# Patient Record
Sex: Female | Born: 1985 | Race: White | Hispanic: No | Smoking: Never smoker
Health system: Southern US, Community
[De-identification: ages and names within clinical notes are randomized; demographics above are authoritative.]

## PROBLEM LIST (undated history)

## (undated) DIAGNOSIS — Z87898 Personal history of other specified conditions: Secondary | ICD-10-CM

## (undated) DIAGNOSIS — O24419 Gestational diabetes mellitus in pregnancy, unspecified control: Secondary | ICD-10-CM

## (undated) DIAGNOSIS — B009 Herpesviral infection, unspecified: Secondary | ICD-10-CM

## (undated) DIAGNOSIS — Z789 Other specified health status: Secondary | ICD-10-CM

## (undated) DIAGNOSIS — K219 Gastro-esophageal reflux disease without esophagitis: Secondary | ICD-10-CM

## (undated) HISTORY — DX: Herpesviral infection, unspecified: B00.9

## (undated) HISTORY — DX: Gestational diabetes mellitus in pregnancy, unspecified control: O24.419

## (undated) HISTORY — DX: Personal history of other specified conditions: Z87.898

---

## 2008-02-17 ENCOUNTER — Observation Stay: Payer: Self-pay | Admitting: Obstetrics and Gynecology

## 2008-03-01 ENCOUNTER — Observation Stay: Payer: Self-pay | Admitting: Internal Medicine

## 2008-03-08 ENCOUNTER — Inpatient Hospital Stay: Payer: Self-pay | Admitting: Unknown Physician Specialty

## 2011-10-14 DIAGNOSIS — Z87898 Personal history of other specified conditions: Secondary | ICD-10-CM

## 2011-10-14 HISTORY — DX: Personal history of other specified conditions: Z87.898

## 2011-10-14 HISTORY — PX: WISDOM TOOTH EXTRACTION: SHX21

## 2011-10-27 ENCOUNTER — Encounter: Payer: Self-pay | Admitting: Family Medicine

## 2011-10-27 ENCOUNTER — Other Ambulatory Visit (HOSPITAL_COMMUNITY)
Admission: RE | Admit: 2011-10-27 | Discharge: 2011-10-27 | Disposition: A | Discharge status: Home / Self Care | Payer: BC Managed Care – PPO | Source: Ambulatory Visit | Attending: Family Medicine | Admitting: Family Medicine

## 2011-10-27 ENCOUNTER — Ambulatory Visit (INDEPENDENT_AMBULATORY_CARE_PROVIDER_SITE_OTHER): Payer: BC Managed Care – PPO | Admitting: Family Medicine

## 2011-10-27 DIAGNOSIS — R87810 Cervical high risk human papillomavirus (HPV) DNA test positive: Secondary | ICD-10-CM | POA: Insufficient documentation

## 2011-10-27 DIAGNOSIS — F329 Major depressive disorder, single episode, unspecified: Secondary | ICD-10-CM

## 2011-10-27 DIAGNOSIS — Z01419 Encounter for gynecological examination (general) (routine) without abnormal findings: Secondary | ICD-10-CM | POA: Insufficient documentation

## 2011-10-27 DIAGNOSIS — Z0000 Encounter for general adult medical examination without abnormal findings: Secondary | ICD-10-CM | POA: Insufficient documentation

## 2011-10-27 MED ORDER — LEVONORGESTREL 20 MCG/24HR IU IUD: 1.0000 | INTRAUTERINE_SYSTEM | Freq: Once | INTRAUTERINE | Status: DC

## 2011-10-27 MED ORDER — BUPROPION HCL ER (XL) 150 MG PO TB24: 150.0000 mg | ORAL_TABLET | Freq: Every day | ORAL | Status: DC

## 2011-10-27 NOTE — Patient Instructions (Signed)
Great to meet you. Please call me in 2-3 weeks with an update of your symptoms.  Take Wellbutrin first thing in the morning.

## 2011-10-27 NOTE — Progress Notes (Signed)
Subjective:     Charlene Garcia is a 26 y.o. female and is here for a comprehensive physical exam. The patient reports feeling depressed..  Married in April, has a 63 1/2 year old daughter. Never had issues with depression in past. Past three months, more tearful. Endorses anhedonia, difficulty concentrating and insomnia.  No SI or HI.  G1P1, no h/o abnormal pap smears.  History   Social History  . Marital Status: Married    Spouse Name: N/A    Number of Children: N/A  . Years of Education: N/A   Occupational History  . Not on file.   Social History Main Topics  . Smoking status: Never Smoker   . Smokeless tobacco: Not on file  . Alcohol Use: Not on file  . Drug Use: Not on file  . Sexually Active: Not on file   Other Topics Concern  . Not on file   Social History Narrative  . No narrative on file   The PMH, PSH, Social History, Family History, Medications, and allergies have been reviewed in [facility], and have been updated if relevant.   Review of Systems  See HPI Patient reports no  vision/ hearing changes,anorexia, weight change, fever ,adenopathy, persistant / recurrent hoarseness, swallowing issues, chest pain, edema,persistant / recurrent cough, hemoptysis, dyspnea(rest, exertional, paroxysmal nocturnal), gastrointestinal  bleeding (melena, rectal bleeding), abdominal pain, excessive heart burn, GU symptoms(dysuria, hematuria, pyuria, voiding/incontinence  Issues) syncope, focal weakness, severe memory loss, concerning skin lesions, anxiety, abnormal bruising/bleeding, major joint swelling, breast masses or abnormal vaginal bleeding.     Objective:  BP 120/70  Pulse 98  Temp(Src) 98.7 F (37.1 C) (Oral)  Ht 5\' 4"  (1.626 m)  Wt 180 lb 12 oz (81.988 kg)  BMI 31.03 kg/m2  General:  Well-developed,well-nourished,in no acute distress; alert,appropriate and cooperative throughout examination Head:  normocephalic and atraumatic.   Eyes:  vision grossly intact,  pupils equal, pupils round, and pupils reactive to light.   Ears:  R ear normal and L ear normal.   Nose:  no external deformity.   Mouth:  good dentition.   Neck:  No deformities, masses, or tenderness noted. Breasts:  No mass, nodules, thickening, tenderness, bulging, retraction, inflamation, nipple discharge or skin changes noted.   Lungs:  Normal respiratory effort, chest expands symmetrically. Lungs are clear to auscultation, no crackles or wheezes. Heart:  Normal rate and regular rhythm. S1 and S2 normal without gallop, murmur, click, rub or other extra sounds. Abdomen:  Bowel sounds positive,abdomen soft and non-tender without masses, organomegaly or hernias noted. Rectal:  no external abnormalities.   Genitalia:  Pelvic Exam:        External: normal female genitalia without lesions or masses        Vagina: normal without lesions or masses        Cervix: normal without lesions or masses, IUD strings visible.        Adnexa: normal bimanual exam without masses or fullness        Uterus: normal by palpation        Pap smear: performed Msk:  No deformity or scoliosis noted of thoracic or lumbar spine.   Extremities:  No clubbing, cyanosis, edema, or deformity noted with normal full range of motion of all joints.   Neurologic:  alert & oriented X3 and gait normal.   Skin:  Intact without suspicious lesions or rashes Cervical Nodes:  No lymphadenopathy noted Axillary Nodes:  No palpable lymphadenopathy Psych:  Cognition and judgment  appear intact. Alert and cooperative with normal attention span and concentration. No apparent delusions, illusions, hallucinations    Assessment and Plan:  1. Routine general medical examination at a health care facility  Reviewed preventive care protocols, scheduled due services, and updated immunizations Discussed nutrition, exercise, diet, and healthy lifestyle.  Cytology -Pap Smear  2. Depression  New- discussed treatment options. Psychotherapy  deferred at this time. Will start Wellbutrin XL 150 mg every morning. Follow up in 2-3 weeks.

## 2011-11-19 ENCOUNTER — Telehealth: Payer: Self-pay | Admitting: Family Medicine

## 2011-11-19 NOTE — Telephone Encounter (Signed)
Triage Record Num: [id number] Operator: Arline Asp Loftin Patient Name: Charlene Garcia Call Date & Time: 11/19/2011 1:24:49PM Patient Phone: [phone] PCP: Ruthe Mannan Patient Gender: Female PCP Fax : [phone] Patient DOB: [birth year] Practice Name: [facility] Reason for Call: Caller: [name]/Patient; PCP: Ruthe Mannan [facility]); CB#: [phone]; Call regarding Blood in the Stool; LMP: IUD. Blood in bowel movement onset 02/05, after anal intercourse approximately 02/03. Emergent sx negative. Care advice per "Gastrointestinal Bleeding" with appt advised within 72h due to "rectal bleeding occuring after anal intercourse." Will call back for appt, call back parameters reviewed. Protocol(s) Used: Gastrointestinal Bleeding Recommended Outcome per Protocol: See Provider within 72 Hours Reason for Outcome: Rectal bleeding or persistent rectal pain occuring after anal intercourse Care Advice: ~ Sit in a warm bath 20 minutes 3 or 4 times a day. ~ Call provider immediately if develop larger amounts of rectal bleeding. ~ Do not sit on toilet for a prolonged time (reading while on toilet) and avoid straining at stool. Call provider if you develop continual rectal pain with swelling or redness or temperature of 101.5 F (38.5 C) or higher. ~ Analgesic/Antipyretic Advice - Acetaminophen: Consider acetaminophen as directed on label or by pharmacist/provider for pain or fever PRECAUTIONS: - Use if there is no history of liver disease, alcoholism, or intake of three or more alcohol drinks per day - Only if approved by provider during pregnancy or when breastfeeding - During pregnancy, acetaminophen should not be taken more than 3 consecutive days without telling provider - Do not exceed recommended dose or frequency ~ Consider use of nonprescription anti-inflammatory topical medication per pharmacist's or label recommendations (such as Preparation H, Anusol-HC, Cortacet) to relieve  itching or burning. Apply small amount to area. ~ 11/19/2011 1:33:53PM Page 1 of 1 [id number]

## 2011-12-29 ENCOUNTER — Other Ambulatory Visit: Payer: Self-pay | Admitting: Family Medicine

## 2012-02-05 ENCOUNTER — Ambulatory Visit (INDEPENDENT_AMBULATORY_CARE_PROVIDER_SITE_OTHER): Payer: BC Managed Care – PPO | Admitting: Family Medicine

## 2012-02-05 ENCOUNTER — Encounter: Payer: Self-pay | Admitting: Family Medicine

## 2012-02-05 VITALS — BP 122/82 | HR 60 | Temp 97.6°F | Ht 64.0 in | Wt 170.2 lb

## 2012-02-05 DIAGNOSIS — R300 Dysuria: Secondary | ICD-10-CM | POA: Insufficient documentation

## 2012-02-05 LAB — POCT URINE PREGNANCY: Preg Test, Ur: NEGATIVE

## 2012-02-05 LAB — POCT URINALYSIS DIPSTICK
Ketones, UA: NEGATIVE
Protein, UA: NEGATIVE
Spec Grav, UA: 1.02

## 2012-02-05 MED ORDER — CIPROFLOXACIN HCL 250 MG PO TABS: 250.0000 mg | ORAL_TABLET | Freq: Two times a day (BID) | ORAL | Status: DC

## 2012-02-05 NOTE — Patient Instructions (Signed)
Looks like urinary infection - treat with cipro twice daily for 3 days. Push fluids and plenty of rest. Update [place] if symptoms not completely resolved after this.

## 2012-02-05 NOTE — Assessment & Plan Note (Addendum)
sxs consistent with uti, treat with cipro 250 bid x 3 days. Update [place] if sxs not improved after this. Upreg today per pt request.

## 2012-02-05 NOTE — Progress Notes (Signed)
Addended by: Josph Macho A on: 02/05/2012 01:33 PM   Modules accepted: Orders

## 2012-02-05 NOTE — Progress Notes (Signed)
  Subjective:    Patient ID: Charlene Garcia, female    DOB: [birth year], 26 y.o.   MRN: [id number]  HPI CC: ?UTI  Yesterday started having some abd cramping, then this morning urgency increased.  Today with dysuria, urgency, frequency.  + abd cramping.  Feeling nauseated.    No hematuria, fevers/chills, back pain or flank pain.  No vomiting.  This past week with blisters on outside of vaginal area.  Declines eval for this as states improved. 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  LMP - unsure.  Has mirena in place.  Review of Systems Per HPI    Objective:   Physical Exam  Nursing note and vitals reviewed. Constitutional: She appears well-developed and well-nourished. No distress.  Abdominal: Soft. Bowel sounds are normal. She exhibits no distension and no mass. There is tenderness (mild pressure) in the suprapubic area. There is no rebound, no guarding and no CVA tenderness.  Musculoskeletal: She exhibits no edema.  Psychiatric: She has a normal mood and affect.       Assessment & Plan:

## 2012-02-08 LAB — URINE CULTURE

## 2012-03-15 ENCOUNTER — Encounter: Payer: Self-pay | Admitting: Family Medicine

## 2012-03-15 ENCOUNTER — Ambulatory Visit (INDEPENDENT_AMBULATORY_CARE_PROVIDER_SITE_OTHER): Payer: BC Managed Care – PPO | Admitting: Family Medicine

## 2012-03-15 VITALS — BP 110/80 | HR 84 | Wt 169.0 lb

## 2012-03-15 DIAGNOSIS — M549 Dorsalgia, unspecified: Secondary | ICD-10-CM

## 2012-03-15 MED ORDER — CYCLOBENZAPRINE HCL 5 MG PO TABS: 5.0000 mg | ORAL_TABLET | Freq: Three times a day (TID) | ORAL | Status: DC | PRN

## 2012-03-15 NOTE — Progress Notes (Signed)
SUBJECTIVE:  Charlene Garcia is a 26 y.o. female who complains of an injury causing mid- lowback pain 2 day(s) ago. The pain is positional with bending or lifting, without radiation down the legs. Mechanism of injury: lifting objects at her daughter's birthday party . Symptoms have been intermittent since that time. Prior history of back problems: recurrent self limited episodes of low back pain in the past. There is no numbness in the legs.  Patient Active Problem List  Diagnoses  . Routine general medical examination at a health care facility  . Depression  . Dysuria  . Back pain   No past medical history on file. No past surgical history on file. History  Substance Use Topics  . Smoking status: Never Smoker   . Smokeless tobacco: Not on file  . Alcohol Use: Not on file   No family history on file. No Known Allergies No current outpatient prescriptions on file prior to visit.   The PMH, PSH, Social History, Family History, Medications, and allergies have been reviewed in [facility], and have been updated if relevant.  OBJECTIVE: BP 110/80  Pulse 84  Wt 169 lb (76.658 kg)  SpO2 97%  Vital signs as noted above. Patient appears to be in mild to moderate pain, antalgic gait noted. Lumbosacral spine area reveals no local tenderness or mass. Painful and reduced LS ROM noted. Straight leg raise is negative bilaterally.  DTR's, motor strength and sensation normal, including heel and toe gait.  Peripheral pulses are palpable.   ASSESSMENT:  lumbar strain  PLAN: For acute pain, rest, intermittent application of cold packs (later, may switch to heat, but do not sleep on heating pad), analgesics and muscle relaxants are recommended. Discussed longer term treatment plan of prn NSAID's and discussed a home back care exercise program with flexion exercise routine. Proper lifting with avoidance of heavy lifting discussed. Consider Physical Therapy and XRay studies if not improving. Call or return  to clinic prn if these symptoms worsen or fail to improve as anticipated.

## 2012-03-15 NOTE — Patient Instructions (Signed)
Good to see you. I think you have a muscle spasm. Apply heat. Aleve and/or tylenol as needed for pain. Flexeril as needed. Call me in 2-3 days if no improvement.

## 2012-03-25 ENCOUNTER — Telehealth: Payer: Self-pay | Admitting: Family Medicine

## 2012-03-25 NOTE — Telephone Encounter (Signed)
Caller: Charlene Garcia/Patient; PCP: Ruthe Mannan ([facility]); CB#: [phone]; ; ; Call regarding She Is Having A. Cramping Pain and It Comes and Goes;  Pt calling regarding abdominal pain "cramping" for "a few months" but worse today. LMP-years. Also, sex is painful. Afebrile. C/o pain when straining with urine. Disp: See provider w/in 24 hrs per Abdominal Pain protocol. Appt made for 6/14 at 0815 with Dr. Alphonsus Sias. Dr. Dayton Martes not in. Pt could not come today-6/13.

## 2012-03-26 ENCOUNTER — Ambulatory Visit: Payer: Self-pay | Admitting: Internal Medicine

## 2012-03-26 ENCOUNTER — Encounter: Payer: Self-pay | Admitting: Internal Medicine

## 2012-03-26 ENCOUNTER — Ambulatory Visit (INDEPENDENT_AMBULATORY_CARE_PROVIDER_SITE_OTHER): Payer: BC Managed Care – PPO | Admitting: Internal Medicine

## 2012-03-26 VITALS — BP 122/80 | HR 90 | Temp 97.8°F | Wt 172.0 lb

## 2012-03-26 DIAGNOSIS — R102 Pelvic and perineal pain: Secondary | ICD-10-CM

## 2012-03-26 DIAGNOSIS — R109 Unspecified abdominal pain: Secondary | ICD-10-CM

## 2012-03-26 DIAGNOSIS — N949 Unspecified condition associated with female genital organs and menstrual cycle: Secondary | ICD-10-CM

## 2012-03-26 LAB — POCT URINALYSIS DIPSTICK
Leukocytes, UA: NEGATIVE
Nitrite, UA: NEGATIVE
pH, UA: 7

## 2012-03-26 NOTE — Patient Instructions (Addendum)
Please try to cut out the soda Please set up the pelvic ultrasound

## 2012-03-26 NOTE — Addendum Note (Signed)
Addended by: Tillman Abide I on: 03/26/2012 09:38 AM   Modules accepted: Orders

## 2012-03-26 NOTE — Assessment & Plan Note (Signed)
Along with dyspareunia and abdominal cramping Discussed stopping soda just in case (with the cramping) Given the back pain (though higher) must consider migration of the IUD Will check ultrasound Urinalysis and pregnancy test negative

## 2012-03-26 NOTE — Addendum Note (Signed)
Addended by: Liane Comber C on: 03/26/2012 10:50 AM   Modules accepted: Orders

## 2012-03-26 NOTE — Progress Notes (Signed)
  Subjective:    Patient ID: Charlene Garcia, female    DOB: 07/24/1986, 26 y.o.   MRN: [id number]  HPI Cramping pains off and on for a couple of months Notes breast tenderness Feels sleepy crampy back pain recurred yesterday also  Cramping is more common in evenings Not related to eating or moving bowels Not much dairy--doesn't bring on problems No sugarless gums or candies. Lots of soda lately--pepsi and Mt Dew--regular  No periods Has Mirena Has pain during sex Married and monogamous with husband (together 5 years)  No urinary problems No dysuria, hematuria or urgency  No current outpatient prescriptions on file prior to visit.    No Known Allergies  No past medical history on file.  No past surgical history on file.  No family history on file.  History   Social History  . Marital Status: Married    Spouse Name: N/A    Number of Children: N/A  . Years of Education: N/A   Occupational History  . Not on file.   Social History Main Topics  . Smoking status: Never Smoker   . Smokeless tobacco: Never Used  . Alcohol Use: No  . Drug Use: No  . Sexually Active: Not on file   Other Topics Concern  . Not on file   Social History Narrative  . No narrative on file   Review of Systems Appetite is okay Weight is going up Bowels have been loose and more frequent. No blood    Objective:   Physical Exam  Constitutional: She appears well-developed and well-nourished. No distress.  Neck: Normal range of motion.  Cardiovascular: Normal rate, regular rhythm and normal heart sounds.  Exam reveals no gallop.   No murmur heard. Pulmonary/Chest: Effort normal and breath sounds normal. No respiratory distress. She has no wheezes. She has no rales.  Abdominal: Soft. There is no tenderness.  Genitourinary:       Normal introitus No cervical motion tenderness Mild adnexal tendeness GC/chlamydia done  Musculoskeletal: She exhibits no edema and no tenderness.    Lymphadenopathy:    She has no cervical adenopathy.          Assessment & Plan:

## 2012-03-29 ENCOUNTER — Telehealth: Payer: Self-pay | Admitting: *Deleted

## 2012-03-29 ENCOUNTER — Encounter: Payer: Self-pay | Admitting: Internal Medicine

## 2012-03-29 NOTE — Telephone Encounter (Signed)
Patient called and requested that you call her at her work number with test results if you receive them today. She does not have her cell phone with her. [phone]. She will be at this number until 6:30.

## 2012-03-30 LAB — GC/CHLAMYDIA PROBE AMP

## 2012-03-30 NOTE — Telephone Encounter (Signed)
Pt left v/m requesting results called to [phone].

## 2012-03-30 NOTE — Telephone Encounter (Signed)
Spoke with patient and advised results   

## 2012-04-13 ENCOUNTER — Encounter: Payer: BC Managed Care – PPO | Admitting: Family Medicine

## 2012-04-13 ENCOUNTER — Telehealth: Payer: Self-pay

## 2012-04-13 NOTE — Telephone Encounter (Signed)
Since 04/09/12 frequency and burning of urination, slight lower back pain. Pt request appt. Appt scheduled Dr Dayton Martes 04/14/12 at 9am. Pt instructed if condition changes or worsens to go to UC if needed.

## 2012-04-14 ENCOUNTER — Ambulatory Visit (INDEPENDENT_AMBULATORY_CARE_PROVIDER_SITE_OTHER): Payer: BC Managed Care – PPO | Admitting: Family Medicine

## 2012-04-14 ENCOUNTER — Encounter: Payer: Self-pay | Admitting: Family Medicine

## 2012-04-14 VITALS — BP 112/88 | HR 68 | Temp 98.1°F | Wt 171.0 lb

## 2012-04-14 DIAGNOSIS — R300 Dysuria: Secondary | ICD-10-CM

## 2012-04-14 LAB — POCT URINALYSIS DIPSTICK
Bilirubin, UA: NEGATIVE
Nitrite, UA: NEGATIVE
pH, UA: 6

## 2012-04-14 MED ORDER — CIPROFLOXACIN HCL 250 MG PO TABS: 250.0000 mg | ORAL_TABLET | Freq: Two times a day (BID) | ORAL | Status: DC

## 2012-04-14 NOTE — Progress Notes (Signed)
SUBJECTIVE: Charlene Garcia is a 26 y.o. female who complains of urinary frequency, urgency and dysuria x 3 days, without flank pain, fever, chills, or abnormal vaginal discharge or bleeding.   Patient Active Problem List  Diagnosis  . Routine general medical examination at a health care facility  . Depression  . Dysuria  . Back pain  . Adnexal tenderness   No past medical history on file. No past surgical history on file. History  Substance Use Topics  . Smoking status: Never Smoker   . Smokeless tobacco: Never Used  . Alcohol Use: No   No family history on file. No Known Allergies No current outpatient prescriptions on file prior to visit.   The PMH, PSH, Social History, Family History, Medications, and allergies have been reviewed in [facility], and have been updated if relevant.   OBJECTIVE: BP 112/88  Pulse 68  Temp 98.1 F (36.7 C)  Wt 171 lb (77.565 kg)   Appears well, in no apparent distress.  Vital signs are normal. The abdomen is soft without tenderness, guarding, mass, rebound or organomegaly. No CVA tenderness or inguinal adenopathy noted. Urine dipstick shows positive for WBC's and positive for RBC's.    ASSESSMENT: UTI uncomplicated without evidence of pyelonephritis  PLAN: Treatment per orders -cipro 500 mg twice daily x 3 days,  also push fluids, may use Pyridium OTC prn. Call or return to clinic prn if these symptoms worsen or fail to improve as anticipated.

## 2012-04-14 NOTE — Addendum Note (Signed)
Addended by: Eliezer Bottom on: 04/14/2012 09:45 AM   Modules accepted: Orders

## 2012-04-20 ENCOUNTER — Ambulatory Visit (INDEPENDENT_AMBULATORY_CARE_PROVIDER_SITE_OTHER): Payer: BC Managed Care – PPO | Admitting: Family Medicine

## 2012-04-20 ENCOUNTER — Encounter: Payer: Self-pay | Admitting: Family Medicine

## 2012-04-20 VITALS — BP 122/80 | HR 66 | Ht 64.0 in | Wt 170.0 lb

## 2012-04-20 DIAGNOSIS — Z23 Encounter for immunization: Secondary | ICD-10-CM

## 2012-04-20 DIAGNOSIS — Z309 Encounter for contraceptive management, unspecified: Secondary | ICD-10-CM

## 2012-04-20 DIAGNOSIS — Z30432 Encounter for removal of intrauterine contraceptive device: Secondary | ICD-10-CM

## 2012-04-20 MED ORDER — NORETHINDRONE-ETH ESTRADIOL 1-35 MG-MCG PO TABS: 1.0000 | ORAL_TABLET | Freq: Every day | ORAL | Status: DC

## 2012-04-20 MED ORDER — HPV QUADRIVALENT VACCINE IM SUSP
0.5000 mL | Freq: Once | INTRAMUSCULAR | Status: AC
  Administered 2012-04-20: 0.5 mL via INTRAMUSCULAR

## 2012-04-20 NOTE — Patient Instructions (Signed)

## 2012-04-20 NOTE — Progress Notes (Signed)
Subjective:    Patient ID: Charlene Garcia, female    DOB: March 31, 1986, 26 y.o.   MRN: [id number]  HPI Patient is a new today referred from Dr. Ruthe Mannan. She comes in for followup of abnormal Pap smear in January that had negative cytology with positive HPV. The patient is 26 years old. She has not received Gardasil series. She additionally had some abdominal pain and was sent for pelvic sonography, which showed a collapsing ovarian cyst. She is without pain at this point. She was previously [facility] at [facility], who delivered her last child and put in an IUD in June 2009. She like her IUD removed and placed on oral contraceptives. She is doing Yasmin the past without difficulty. Past Medical History  Diagnosis Date  . 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 Herpes simplex infection     Inside mouth   Past Surgical History  Procedure Date  . Cesarean section     2009  . Wisdom tooth extraction 2013    x1   History   Social History  . Marital Status: Married    Spouse Name: N/A    Number of Children: N/A  . Years of Education: N/A   Occupational History  . Not on file.   Social History Main Topics  . Smoking status: Never Smoker   . Smokeless tobacco: Never Used  . Alcohol Use: No     occasionally  . Drug Use: No  . Sexually Active: Yes -- Female partner(s)    Birth Control/ Protection: IUD   Other Topics Concern  . Not on file   Social History Narrative  . No narrative on file   Family History  Problem Relation Age of Onset  . Heart disease Mother   . Heart disease Brother   . Heart disease Maternal Grandfather    Current outpatient prescriptions:norethindrone-ethinyl estradiol 1/35 (ORTHO-NOVUM 1/35, 28,) tablet, Take 1 tablet by mouth daily., Disp: 1 Package, Rfl: 11 Current facility-administered medications:hpv vaccine (GARDASIL) injection 0.5 mL, 0.5 mL, Intramuscular, Once, Reva Bores, MD, 0.5 mL at 04/20/12 1449 No Known  Allergies    Review of Systems  Constitutional: Negative for fever and chills.  HENT: Negative for nosebleeds, congestion and rhinorrhea.   Eyes: Negative for visual disturbance.  Respiratory: Negative for chest tightness and shortness of breath.   Cardiovascular: Negative for chest pain.  Gastrointestinal: Negative for nausea, vomiting, abdominal pain, diarrhea, constipation, blood in stool and abdominal distention.  Genitourinary: Negative for dysuria, frequency, vaginal bleeding and menstrual problem.  Musculoskeletal: Negative for arthralgias.  Skin: Negative for rash.  Neurological: Negative for dizziness and headaches.  Psychiatric/Behavioral: Negative for behavioral problems, disturbed wake/sleep cycle and dysphoric mood.  All other systems reviewed and are negative.       Objective:   Physical Exam  Vitals reviewed. Constitutional: She is oriented to person, place, and time. She appears well-developed and well-nourished.  HENT:  Head: Normocephalic and atraumatic.  Neck: Neck supple.  Cardiovascular: Normal rate.   Pulmonary/Chest: Effort normal.  Abdominal: Soft. There is no tenderness.  Genitourinary: Vagina normal and uterus normal. No vaginal discharge found.  Musculoskeletal: Normal range of motion.  Neurological: She is alert and oriented to person, place, and time.  Skin: Skin is warm and dry. No rash noted.    Procedure: IUD Removal  Patient was in the dorsal lithotomy position. The strings of the IUD were grasped and pulled using kelly clamp.  The IUD  was successfully removed in its entirety. Patient tolerated the procedure well.        Assessment & Plan:   1. Contraception management   2. Need for HPV vaccination   3. Encounter for IUD removal    Begin OC's. Gardasil series

## 2012-05-12 ENCOUNTER — Ambulatory Visit: Payer: BC Managed Care – PPO | Admitting: Family Medicine

## 2012-05-14 ENCOUNTER — Ambulatory Visit (INDEPENDENT_AMBULATORY_CARE_PROVIDER_SITE_OTHER): Payer: BC Managed Care – PPO | Admitting: Family Medicine

## 2012-05-14 ENCOUNTER — Encounter: Payer: Self-pay | Admitting: Family Medicine

## 2012-05-14 VITALS — BP 123/78 | HR 70 | Ht 64.0 in | Wt 173.0 lb

## 2012-05-14 DIAGNOSIS — IMO0002 Reserved for concepts with insufficient information to code with codable children: Secondary | ICD-10-CM

## 2012-05-14 DIAGNOSIS — N912 Amenorrhea, unspecified: Secondary | ICD-10-CM

## 2012-05-14 NOTE — Patient Instructions (Signed)
Diagnostic Laparoscopy Laparoscopy is a surgical procedure. It is used to diagnose and treat diseases inside the belly(abdomen). It is usually a brief, common, and relatively simple procedure. The laparoscopeis a thin, lighted, pencil-sized instrument. It is like a telescope. It is inserted into your abdomen through a small cut (incision). Your caregiver can look at the organs inside your body through this instrument. He or she can see if there is anything abnormal. Laparoscopy can be done either in a hospital or outpatient clinic. You may be given a mild sedative to help you relax before the procedure. Once in the operating room, you will be given a drug to make you sleep (general anesthesia). Laparoscopy usually lasts less than 1 hour. After the procedure, you will be monitored in a recovery area until you are stable and doing well. Once you are home, it will take 2 to 3 days to fully recover. RISKS AND COMPLICATIONS  Laparoscopy has relatively few risks. Your caregiver will discuss the risks with you before the procedure. Some problems that can occur include:  Infection.   Bleeding.   Damage to other organs.   Anesthetic side effects.  PROCEDURE Once you receive anesthesia, your surgeon inflates the abdomen with a harmless gas (carbon dioxide). This makes the organs easier to see. The laparoscope is inserted into the abdomen through a small incision. This allows your surgeon to see into the abdomen. Other small instruments are also inserted into the abdomen through other small openings. Many surgeons attach a video camera to the laparoscope to enlarge the view. During a diagnostic laparoscopy, the surgeon may be looking for inflammation, infection, or cancer. Your surgeon may take tissue samples(biopsies). The samples are sent to a specialist in looking at cells and tissue samples (pathologist). The pathologist examines them under a microscope. Biopsies can help to diagnose or confirm a  disease. AFTER THE PROCEDURE   The gas is released from inside the abdomen.   The incisions are closed with stitches (sutures). Because these incisions are small (usually less than 1/2 inch), there is usually minimal discomfort after the procedure. There may be some mild discomfort in the throat. This is from the tube placed in the throat while you were sleeping. You may have some mild abdominal discomfort. There may also be discomfort from the instrument placement incisions in the abdomen.   The recovery time is shortened as long as there are no complications.   You will rest in a recovery room until stable and doing well. As long as there are no complications, you may be allowed to go home.  FINDING OUT THE RESULTS OF YOUR TEST Not all test results are available during your visit. If your test results are not back during the visit, make an appointment with your caregiver to find out the results. Do not assume everything is normal if you have not heard from your caregiver or the medical facility. It is important for you to follow up on all of your test results. HOME CARE INSTRUCTIONS   Take all medicines as directed.   Only take over-the-counter or prescription medicines for pain, discomfort, or fever as directed by your caregiver.   Resume daily activities as directed.   Showers are preferred over baths.   You may resume sexual activities in 1 week or as directed.   Do not drive while taking narcotics.  SEEK MEDICAL CARE IF:   There is increasing abdominal pain.   There is new pain in the shoulders (shoulder strap areas).     You feel lightheaded or faint.   You have the chills.   You or your child has an oral temperature above 102 F (38.9 C).   There is pus-like (purulent) drainage from any of the wounds.   You are unable to pass gas or have a bowel movement.   You feel sick to your stomach (nauseous) or throw up (vomit).  MAKE SURE YOU:   Understand these instructions.    Will watch your condition.   Will get help right away if you are not doing well or get worse.  Document Released: 01/05/2001 Document Revised: 09/18/2011 Document Reviewed: 09/29/2007 [facility] Patient Information 2012 [place], [place].Dyspareunia Dyspareunia is pain during sexual intercourse. It is most common in women, but it also happens in men.  CAUSES  Female The pain from this condition is usually felt when anything is put into the vagina, but any part of the genitals may cause pain during sex. Even sitting or wearing pants can cause pain. Sometimes, a cause cannot be found. Some causes of pain during intercourse are:  Infections of the skin around the vagina.   Vaginal infections, such as a yeast, bacterial, or viral infection.   Vaginismus. This is the inability to have anything put in the vagina even when the woman wants it to happen. There is an automatic muscle contraction and pain. The pain of the muscle contraction can be so severe that intercourse is impossible.   Allergic reaction from spermicides, semen, condoms, scented tampons, soaps, douches, and vaginal sprays.   A fluid-filled sac (cyst) on the Bartholin or Skene glands, located at the opening of the vagina.   Scar tissue in the vagina from a surgically enlarged opening (episiotomy) or tearing after delivering a baby.   Vaginal dryness. This is more common in menopause. The normal secretions of the vagina are decreased. Changes in estrogen levels and increased difficulty becoming aroused can cause painful sex. Vaginal dryness can also happen when taking birth control pills.   Thinning of the tissue (atrophy) of the vulva and vagina. This makes the area thinner, smaller, unable to stretch to accommodate a penis, and prone to infection and tearing.   Vulvar vestibulitis or vestibulodynia.This is a condition that causes pain involving the area around the entrance to the vagina.The most common cause in young women is  birth control pills.Women with low estrogen levels (postmenopausal women) may also experience this.Other causes include allergic reactions, too many nerve endings, skin conditions, and pelvic muscles that cannot relax.   Vulvar dermatoses. This includes skin conditions such as lichen sclerosus and lichen planus.   Lack of foreplay to lubricate the vagina. This can cause vaginal dryness.   Noncancerous tumors (fibroids) in the uterus.   Uterus lining tissue growing outside the uterus (endometriosis).   Pregnancy that starts in the fallopian tube (tubal pregnancy).   Pregnancy or breastfeeding your baby. This can cause vaginal dryness.   A tilting or prolapse of the uterus. Prolapse is when weak and stretched muscles around the uterus allow it to fall into the vagina.   Problems with the ovaries, cysts, or scar tissue. This may be worse with certain sexual positions.   Previous surgeries causing adhesions or scar tissue in the vagina or pelvis.   Bladder and intestinal problems.   Psychological problems (such as depression or anxiety). This may make pain worse.   Negative attitudes about sex, experiencing rape, sexual assault, and misinformation about sex. These issues are often related to some types of pain.  Previous pelvic infection, causing scar tissue in the pelvis and on the female organs.   Cyst or tumor on the ovary.   Cancer of the female organs.   Certain medicines.   Medical problems such as diabetes, arthritis, or thyroid disease.  Female In men, there are many physical causes of sexual discomfort. Some causes of pain during intercourse are:  Infections of the prostate, bladder, or seminal vesicles. This can cause pain after ejaculation.   An inflamed bladder (interstitial cystitis). This may cause pain from ejaculation.   Gonorrheal infections. This may cause pain during ejaculation.   An inflamed urethra (urethritis) or inflamed prostate (prostatitis). This  can make genital stimulation painful or uncomfortable.   Deformities of the penis, such as Peyronie's disease.   A tight foreskin.   Cancer of the female organs.   Psychological problems. This may make pain worse.  DIAGNOSIS   Your caregiver will take a history and have you describe where the pain is located (outside the vagina, in the vagina, in the pelvis). You may be asked when you experience pain, such as with penetration or with thrusting.   Following this, your caregiver will do a physical exam. Let your caregiver know if the exam is too painful.   During the final part of the female exam, your caregiver will feel your uterus and ovaries with one hand on the abdomen and one finger in your vagina. This is a pelvic exam.   Blood tests, a Pap test, cultures for infection, an ultrasound test, and X-rays may be done. You may need to see a specialist for female problems (gynecologist).   Your caregiver may do a CT scan, MRI, or laparoscopy. Laparoscopy is a procedure to look into the pelvis with a lighted tube, through a cut (incision) in the abdomen.  TREATMENT  Your caregiver can help you determine the best course of treatment. Sometimes, more testing is done. Continue with the suggested testing until your caregiver feels sure about your diagnosis and how to treat it. Sometimes, it is difficult to find the reason for the pain. The search for the cause and treatment can be frustrating. Treatment often takes several weeks to a few months before you notice any improvement. You may also need to avoid sexual activity until symptoms improve.Continuing to have sex when it hurts can delay healing and actually make the problem worse. The treatment depends on the cause of the pain. Treatment may include:  Medicines such as antibiotics, vaginal or skin creams, hormones, or antidepressants.   Minor or major surgery.   Psychological counseling or group therapy.   Kegel exercises and vaginal  dilators to help certain cases of vaginismus (spasms). Do this only if recommended by your caregiver.Kegel exercises can make some problems worse.   Applying lubrication as recommended by your caregiver if you have dryness.   Sex therapy for you and your sex partner.  It is common for the pain to continue after the reason for the pain has been treated. Some reasons for this include a conditioned response. This means the person having the pain becomes so familiar with the pain that the pain continues as a response, even though the cause is removed. Sex therapy can help with this problem. HOME CARE INSTRUCTIONS   Follow your caregiver's instructions about taking medicines, tests, counseling, and follow-up treatment.   Do not use scented tampons, douches, vaginal sprays, or soaps.   Use water-based lubricants for dryness. Oil lubricants can cause irritation.  Do not use spermicides or condoms that irritate you.   Openly discuss with your partner your sexual experience, your desires, foreplay, and different sexual positions for a more comfortable and enjoyable sexual relationship.   Join group sessions for therapy, if needed.   Practice safe sex at all times.   Empty your bladder before having intercourse.   Try different positions during sexual intercourse.   Take over-the-counter pain medicine recommended by your caregiver before having sexual intercourse.   Do not wear pantyhose. Knee-high and thigh-high hose are okay.   Avoid scrubbing your vulva with a washcloth. Wash the area gently and pat dry with a towel.  SEEK MEDICAL CARE IF:   You develop vaginal bleeding after sexual intercourse.   You develop a lump at the opening of your vagina, even if it is not painful.   You have abnormal vaginal discharge.   You have vaginal dryness.   You have itching or irritation of the vulva or vagina.   You develop a rash or reaction to your medicine.  SEEK IMMEDIATE MEDICAL CARE IF:    You develop severe abdominal pain during or shortly after sexual intercourse. You could have a ruptured ovarian cyst or ruptured tubal pregnancy.   You have a fever.   You have painful or bloody urination.   You have painful sexual intercourse, and you never had it before.   You pass out after having sexual intercourse.  Document Released: 10/19/2007 Document Revised: 09/18/2011 Document Reviewed: 12/30/2010 [facility] Patient Information 2012 [place], [place].

## 2012-05-14 NOTE — Progress Notes (Signed)
  Subjective:    Patient ID: Charlene Garcia, female    DOB: April 13, 1986, 26 y.o.   MRN: [id number]  HPI  Patient returns 3 weeks after removal of her IUD. She did not start her oral contraceptives as directed. She had a UPT today which is negative. She also complains of painful intercourse. This was continued from many months. She previously had an ultrasound showing a resolving cyst which seems to have completely resolved and is no longer causing her issue. She reports painful intercourse as deep on the inside of that she has adequate lubrication. She does have some trouble with her boyfriend. She notes no vaginal discharge or odor. Pain is completely resolved when intercourse is over. She does have a strong history of painful, heavy cycles prior to IUD insertion.  Review of Systems  Constitutional: Negative for fever and fatigue.  Respiratory: Negative for shortness of breath and wheezing.   Cardiovascular: Negative for chest pain and leg swelling.  Gastrointestinal: Negative for nausea, abdominal pain and diarrhea.  Genitourinary: Positive for pelvic pain. Negative for vaginal bleeding and vaginal discharge (with intercourse).       Objective:   Physical Exam  Vitals reviewed. Constitutional: She appears well-developed and well-nourished.  Abdominal: Soft. There is no tenderness.  Genitourinary:       The uterus was normal vagina is pink and radiated cervix is parous without lesion uterus is small and anteverted there is no cervical motion tenderness there is no bladder tenderness there is no adnexal tenderness or mass. There is no nodularity noted.          Assessment & Plan:   1. Amenorrhea  POCT urine pregnancy  Do not start oral contraceptives until next normal period. Return pregnancy test has not started in the next 2 weeks.  2.  Dyspareunia  scheduled for diagnostic laparoscopy. Risks and benefits of this procedure were discussed with the patient length. Patient has a  history of prior cesarean section. Given her history of heavy, painful cycles and dyspareunia, rule out endometriosis except a laparoscopy.

## 2012-06-16 ENCOUNTER — Encounter (HOSPITAL_COMMUNITY): Payer: Self-pay | Admitting: Pharmacist

## 2012-06-22 ENCOUNTER — Encounter: Payer: Self-pay | Admitting: Family Medicine

## 2012-06-22 ENCOUNTER — Ambulatory Visit (INDEPENDENT_AMBULATORY_CARE_PROVIDER_SITE_OTHER): Payer: BC Managed Care – PPO | Admitting: Family Medicine

## 2012-06-22 VITALS — BP 110/84 | Temp 98.0°F | Wt 172.0 lb

## 2012-06-22 DIAGNOSIS — J069 Acute upper respiratory infection, unspecified: Secondary | ICD-10-CM

## 2012-06-22 MED ORDER — HYDROCOD POLST-CHLORPHEN POLST 10-8 MG/5ML PO LQCR: 5.0000 mL | Freq: Every evening | ORAL | Status: DC | PRN

## 2012-06-22 NOTE — Patient Instructions (Addendum)
This is likely a virus.  Drink lots of fluids.  Treat sympotmatically with Mucinex, nasal saline irrigation, and Tylenol/Ibuprofen.   Cough suppressant at night.  Call if not improving as expected in 5-7 days.

## 2012-06-22 NOTE — Progress Notes (Signed)
SUBJECTIVE:  Charlene Garcia is a 26 y.o. female who complains of coryza, congestion, sneezing and sore throat for 3 days. She denies a history of anorexia, chest pain, chills, dizziness, fatigue, myalgias, shortness of breath, sweats and vomiting and denies a history of asthma. Patient denies smoke cigarettes.   Patient Active Problem List  Diagnosis  . Depression  . Back pain  . Dyspareunia   Past Medical History  Diagnosis Date  . 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 Herpes simplex infection     Inside mouth   Past Surgical History  Procedure Date  . Cesarean section     2009  . Wisdom tooth extraction 2013    x1   History  Substance Use Topics  . Smoking status: Never Smoker   . Smokeless tobacco: Never Used  . Alcohol Use: No     occasionally   Family History  Problem Relation Age of Onset  . Heart disease Mother   . Heart disease Brother   . Heart disease Maternal Grandfather    No Known Allergies Current Outpatient Prescriptions on File Prior to Visit  Medication Sig Dispense Refill  . guaiFENesin (ROBITUSSIN) 100 MG/5ML liquid Take 200 mg by mouth 3 (three) times daily as needed. For cough       The PMH, PSH, Social History, Family History, Medications, and allergies have been reviewed in [facility], and have been updated if relevant.  OBJECTIVE: BP 110/84  Temp 98 F (36.7 C)  Wt 172 lb (78.019 kg)  She appears well, vital signs are as noted. Ears normal.  Throat and pharynx normal.  Neck supple. No adenopathy in the neck. Nose is congested. Sinuses non tender. The chest is clear, without wheezes or rales.  ASSESSMENT:  viral upper respiratory illness  PLAN: Symptomatic therapy suggested: push fluids, rest and return office visit prn if symptoms persist or worsen. Lack of antibiotic effectiveness discussed with her. Call or return to clinic prn if these symptoms worsen or fail to improve as anticipated.

## 2012-06-25 ENCOUNTER — Encounter (HOSPITAL_COMMUNITY): Payer: Self-pay

## 2012-06-25 ENCOUNTER — Encounter (HOSPITAL_COMMUNITY)
Admission: RE | Admit: 2012-06-25 | Discharge: 2012-06-25 | Disposition: A | Discharge status: Home / Self Care | Payer: BC Managed Care – PPO | Source: Ambulatory Visit | Attending: Family Medicine | Admitting: Family Medicine

## 2012-06-25 HISTORY — DX: Other specified health status: Z78.9

## 2012-06-25 LAB — SURGICAL PCR SCREEN: MRSA, PCR: NEGATIVE

## 2012-06-25 LAB — CBC
Hemoglobin: 12.9 g/dL (ref 12.0–15.0)
MCH: 29.3 pg (ref 26.0–34.0)
MCHC: 33.7 g/dL (ref 30.0–36.0)
MCV: 86.8 fL (ref 78.0–100.0)
RBC: 4.41 MIL/uL (ref 3.87–5.11)

## 2012-06-25 NOTE — Patient Instructions (Addendum)
     Your procedure is scheduled on:06/30/12  Enter through the Main Entrance at :1245 pm Pick up desk phone and dial [phone] and inform us of your arrival.  Please call [phone] if you have any problems the morning of surgery.  Remember: Do not eat after midnight: Tuesday Do not drink after:10 am Wed  Take these meds the morning of surgery with a sip of water:none  DO NOT wear jewelry, eye make-up, lipstick,body lotion, or dark fingernail polish. Do not shave for 48 hours prior to surgery.  If you are to be admitted after surgery, leave suitcase in car until your room has been assigned. Patients discharged on the day of surgery will not be allowed to drive home.   Remember to use your Hibiclens as instructed.

## 2012-06-30 ENCOUNTER — Ambulatory Visit (HOSPITAL_COMMUNITY): Payer: BC Managed Care – PPO | Admitting: Anesthesiology

## 2012-06-30 ENCOUNTER — Encounter (HOSPITAL_COMMUNITY)
Admission: RE | Disposition: A | Discharge status: Home / Self Care | Payer: Self-pay | Source: Ambulatory Visit | Attending: Family Medicine

## 2012-06-30 ENCOUNTER — Telehealth: Payer: Self-pay

## 2012-06-30 ENCOUNTER — Encounter (HOSPITAL_COMMUNITY): Payer: Self-pay | Admitting: Anesthesiology

## 2012-06-30 ENCOUNTER — Ambulatory Visit (HOSPITAL_COMMUNITY)
Admission: RE | Admit: 2012-06-30 | Discharge: 2012-06-30 | Disposition: A | Discharge status: Home / Self Care | Payer: BC Managed Care – PPO | Source: Ambulatory Visit | Attending: Family Medicine | Admitting: Family Medicine

## 2012-06-30 DIAGNOSIS — G8929 Other chronic pain: Secondary | ICD-10-CM | POA: Diagnosis present

## 2012-06-30 DIAGNOSIS — Z01812 Encounter for preprocedural laboratory examination: Secondary | ICD-10-CM | POA: Insufficient documentation

## 2012-06-30 DIAGNOSIS — N946 Dysmenorrhea, unspecified: Secondary | ICD-10-CM | POA: Insufficient documentation

## 2012-06-30 DIAGNOSIS — Z01818 Encounter for other preprocedural examination: Secondary | ICD-10-CM | POA: Insufficient documentation

## 2012-06-30 DIAGNOSIS — IMO0002 Reserved for concepts with insufficient information to code with codable children: Secondary | ICD-10-CM | POA: Insufficient documentation

## 2012-06-30 DIAGNOSIS — N949 Unspecified condition associated with female genital organs and menstrual cycle: Secondary | ICD-10-CM

## 2012-06-30 DIAGNOSIS — R102 Pelvic and perineal pain: Secondary | ICD-10-CM | POA: Diagnosis present

## 2012-06-30 HISTORY — PX: LAPAROSCOPY: SHX197

## 2012-06-30 SURGERY — LAPAROSCOPY OPERATIVE
Anesthesia: General | Site: Abdomen | Wound class: Clean Contaminated

## 2012-06-30 MED ORDER — FENTANYL CITRATE 0.05 MG/ML IJ SOLN
INTRAMUSCULAR | Status: AC
Start: 2012-06-30 — End: 2012-06-30
  Filled 2012-06-30: qty 5

## 2012-06-30 MED ORDER — ROCURONIUM BROMIDE 100 MG/10ML IV SOLN
INTRAVENOUS | Status: DC | PRN
  Administered 2012-06-30: 30 mg via INTRAVENOUS

## 2012-06-30 MED ORDER — GLYCOPYRROLATE 0.2 MG/ML IJ SOLN
INTRAMUSCULAR | Status: DC | PRN
  Administered 2012-06-30: 0.2 mg via INTRAVENOUS

## 2012-06-30 MED ORDER — FENTANYL CITRATE 0.05 MG/ML IJ SOLN
INTRAMUSCULAR | Status: AC
  Administered 2012-06-30: 50 ug via INTRAVENOUS
  Filled 2012-06-30: qty 2

## 2012-06-30 MED ORDER — MIDAZOLAM HCL 5 MG/5ML IJ SOLN
INTRAMUSCULAR | Status: DC | PRN
  Administered 2012-06-30: 2 mg via INTRAVENOUS

## 2012-06-30 MED ORDER — DEXAMETHASONE SODIUM PHOSPHATE 10 MG/ML IJ SOLN
INTRAMUSCULAR | Status: AC
  Filled 2012-06-30: qty 1

## 2012-06-30 MED ORDER — OXYCODONE-ACETAMINOPHEN 5-325 MG PO TABS
1.0000 | ORAL_TABLET | Freq: Once | ORAL | Status: AC
  Administered 2012-06-30: 1 via ORAL

## 2012-06-30 MED ORDER — LACTATED RINGERS IV SOLN
INTRAVENOUS | Status: DC
  Administered 2012-06-30: 13:00:00 via INTRAVENOUS

## 2012-06-30 MED ORDER — KETOROLAC TROMETHAMINE 30 MG/ML IJ SOLN
INTRAMUSCULAR | Status: DC | PRN
  Administered 2012-06-30: 30 mg via INTRAVENOUS

## 2012-06-30 MED ORDER — BUPIVACAINE HCL (PF) 0.25 % IJ SOLN
INTRAMUSCULAR | Status: AC
  Filled 2012-06-30: qty 30

## 2012-06-30 MED ORDER — OXYCODONE-ACETAMINOPHEN 5-325 MG PO TABS
ORAL_TABLET | ORAL | Status: AC
  Administered 2012-06-30: 1 via ORAL
  Filled 2012-06-30: qty 1

## 2012-06-30 MED ORDER — OXYCODONE-ACETAMINOPHEN 5-325 MG PO TABS: 1.0000 | ORAL_TABLET | Freq: Four times a day (QID) | ORAL | Status: DC | PRN

## 2012-06-30 MED ORDER — PROPOFOL 10 MG/ML IV EMUL
INTRAVENOUS | Status: DC | PRN
  Administered 2012-06-30: 180 mg via INTRAVENOUS

## 2012-06-30 MED ORDER — PROPOFOL 10 MG/ML IV EMUL
INTRAVENOUS | Status: AC
  Filled 2012-06-30: qty 20

## 2012-06-30 MED ORDER — ROCURONIUM BROMIDE 50 MG/5ML IV SOLN
INTRAVENOUS | Status: AC
  Filled 2012-06-30: qty 1

## 2012-06-30 MED ORDER — NEOSTIGMINE METHYLSULFATE 1 MG/ML IJ SOLN
INTRAMUSCULAR | Status: DC | PRN
  Administered 2012-06-30: 1 mg via INTRAVENOUS

## 2012-06-30 MED ORDER — LACTATED RINGERS IV SOLN
INTRAVENOUS | Status: DC
  Administered 2012-06-30 (×3): via INTRAVENOUS

## 2012-06-30 MED ORDER — LIDOCAINE HCL (CARDIAC) 20 MG/ML IV SOLN
INTRAVENOUS | Status: AC
  Filled 2012-06-30: qty 5

## 2012-06-30 MED ORDER — BUPIVACAINE HCL (PF) 0.25 % IJ SOLN
INTRAMUSCULAR | Status: DC | PRN
  Administered 2012-06-30: 5 mL

## 2012-06-30 MED ORDER — CEFAZOLIN SODIUM-DEXTROSE 2-3 GM-% IV SOLR
INTRAVENOUS | Status: AC
  Filled 2012-06-30: qty 50

## 2012-06-30 MED ORDER — METOCLOPRAMIDE HCL 5 MG/ML IJ SOLN: 10.0000 mg | Freq: Once | INTRAMUSCULAR | Status: DC | PRN

## 2012-06-30 MED ORDER — ONDANSETRON HCL 4 MG/2ML IJ SOLN
INTRAMUSCULAR | Status: DC | PRN
  Administered 2012-06-30: 4 mg via INTRAVENOUS

## 2012-06-30 MED ORDER — DEXAMETHASONE SODIUM PHOSPHATE 4 MG/ML IJ SOLN
INTRAMUSCULAR | Status: DC | PRN
  Administered 2012-06-30: 10 mg via INTRAVENOUS

## 2012-06-30 MED ORDER — KETOROLAC TROMETHAMINE 30 MG/ML IJ SOLN
INTRAMUSCULAR | Status: AC
  Filled 2012-06-30: qty 1

## 2012-06-30 MED ORDER — FENTANYL CITRATE 0.05 MG/ML IJ SOLN
25.0000 ug | INTRAMUSCULAR | Status: DC | PRN
  Administered 2012-06-30: 50 ug via INTRAVENOUS

## 2012-06-30 MED ORDER — MEPERIDINE HCL 25 MG/ML IJ SOLN: 6.2500 mg | INTRAMUSCULAR | Status: DC | PRN

## 2012-06-30 MED ORDER — MIDAZOLAM HCL 2 MG/2ML IJ SOLN
INTRAMUSCULAR | Status: AC
  Filled 2012-06-30: qty 2

## 2012-06-30 MED ORDER — FENTANYL CITRATE 0.05 MG/ML IJ SOLN
INTRAMUSCULAR | Status: DC | PRN
  Administered 2012-06-30: 100 ug via INTRAVENOUS
  Administered 2012-06-30: 50 ug via INTRAVENOUS
  Administered 2012-06-30: 100 ug via INTRAVENOUS

## 2012-06-30 MED ORDER — CEFAZOLIN SODIUM-DEXTROSE 2-3 GM-% IV SOLR
2.0000 g | INTRAVENOUS | Status: AC
  Administered 2012-06-30: 2 g via INTRAVENOUS

## 2012-06-30 MED ORDER — ONDANSETRON HCL 4 MG/2ML IJ SOLN
INTRAMUSCULAR | Status: AC
  Filled 2012-06-30: qty 2

## 2012-06-30 MED ORDER — LIDOCAINE HCL (CARDIAC) 20 MG/ML IV SOLN
INTRAVENOUS | Status: DC | PRN
  Administered 2012-06-30: 50 mg via INTRAVENOUS

## 2012-06-30 SURGICAL SUPPLY — 33 items
CABLE HIGH FREQUENCY MONO STRZ (ELECTRODE) IMPLANT
CATH ROBINSON RED A/P 16FR (CATHETERS) ×2 IMPLANT
CHLORAPREP W/TINT 26ML (MISCELLANEOUS) ×2 IMPLANT
CLOTH BEACON ORANGE TIMEOUT ST (SAFETY) ×2 IMPLANT
DECANTER SPIKE VIAL GLASS SM (MISCELLANEOUS) ×2 IMPLANT
DRSG COVADERM PLUS 2X2 (GAUZE/BANDAGES/DRESSINGS) ×2 IMPLANT
ELECT REM PT RETURN 9FT ADLT (ELECTROSURGICAL) ×2
ELECTRODE REM PT RTRN 9FT ADLT (ELECTROSURGICAL) ×1 IMPLANT
FORCEPS CUTTING 33CM 5MM (CUTTING FORCEPS) IMPLANT
FORCEPS CUTTING 45CM 5MM (CUTTING FORCEPS) IMPLANT
GLOVE BIOGEL PI IND STRL 7.0 (GLOVE) ×1 IMPLANT
GLOVE BIOGEL PI IND STRL 7.5 (GLOVE) ×1 IMPLANT
GLOVE BIOGEL PI INDICATOR 7.0 (GLOVE) ×1
GLOVE BIOGEL PI INDICATOR 7.5 (GLOVE) ×1
GLOVE ECLIPSE 7.0 STRL STRAW (GLOVE) ×6 IMPLANT
GOWN PREVENTION PLUS LG XLONG (DISPOSABLE) IMPLANT
GOWN PREVENTION PLUS XLARGE (GOWN DISPOSABLE) ×4 IMPLANT
NS IRRIG 1000ML POUR BTL (IV SOLUTION) ×2 IMPLANT
PACK LAPAROSCOPY BASIN (CUSTOM PROCEDURE TRAY) ×2 IMPLANT
PAD OB MATERNITY 4.3X12.25 (PERSONAL CARE ITEMS) ×2 IMPLANT
POUCH SPECIMEN RETRIEVAL 10MM (ENDOMECHANICALS) IMPLANT
PROTECTOR NERVE ULNAR (MISCELLANEOUS) ×4 IMPLANT
SET IRRIG TUBING LAPAROSCOPIC (IRRIGATION / IRRIGATOR) IMPLANT
SUT VIC AB 3-0 X1 27 (SUTURE) IMPLANT
SUT VICRYL 0 UR6 27IN ABS (SUTURE) ×4 IMPLANT
SUT VICRYL 4-0 PS2 18IN ABS (SUTURE) ×2 IMPLANT
TOWEL OR 17X24 6PK STRL BLUE (TOWEL DISPOSABLE) ×4 IMPLANT
TRAY FOLEY CATH 14FR (SET/KITS/TRAYS/PACK) ×2 IMPLANT
TROCAR BALLN 12MMX100 BLUNT (TROCAR) ×2 IMPLANT
TROCAR Z-THREAD BLADED 11X100M (TROCAR) ×2 IMPLANT
TROCAR Z-THREAD BLADED 5X100MM (TROCAR) ×2 IMPLANT
WARMER LAPAROSCOPE (MISCELLANEOUS) ×2 IMPLANT
WATER STERILE IRR 1000ML POUR (IV SOLUTION) ×2 IMPLANT

## 2012-06-30 NOTE — Op Note (Addendum)
Preoperative diagnosis: Pelvic pain   Postoperative diagnosis: Same  Procedure: Diagnostic laparoscopy  Surgeon: Tinnie Gens, MD  Anesthesia: GETT-Foster  Findings: Normal appearing uterus, tubes, ovaries.  No evidence of endometriosis.  Normal anterior and posterior cul-de-sacs.  Normal liver edge, no significant adhesive disease.  Estimated blood loss: Minimal  Complications: None known  Specimens: Peritoneal biopsies  Reason for procedure: 26 y.o.G1P1 with several year h/o worsening dysmenorrhea and dyspareunia who desired laparoscopy for diagnosis.  Normal pelvic sonogram.   Procedure: Patient was taken to the operating room was placed in dorsal lithotomy in Allen stirrups. She was prepped and draped in the usual sterile fashion. A timeout was performed. The patient received 2 g of Ancef and SCDs were in place. Red rubber catheter is used to drain bladder. Speculum was placed inside the vagina. The cervix was visualized and grasped anteriorly with a single-tooth tenaculum. A Hulka tenaculum was placed through the cervix for uterine manipulation. The single tooth tenaculum and speculum were removed from the vagina.  Attention was then turned to the abdomen. Four cc of 0.25% Marcaine was injected at the umbilicus. Two Allis clamps were used to tent up the skin of the umbilicus a vertical one half centimeter incision was made here. The fascia was incised with the knife  And the peritoneum was entered sharply with this incision. Two edges of the fascia were tagged with a 0 Vicryl suture on a UR 6 needle. A Hassan trocar was placed through this incision and a pneumoperitoneum was created. The patient was then placed in Trendelenburg. The pelvis was inspected in a systematic fashion. The patient's right and left tubes and ovaries appeared normal. The posterior and anterior cul-de-sacs were inspected and felt to be normal. The ovarian fossa were inspected and also felt to be normal. The appendix was  not seen. The upper abdomen was inspected: the liver edge, gallbladder and stomach appeared normal. Multiple biopsies were done on the right and left anterior cul-de-sac and the posterior cul-de-sac. There were no additional findings in the pelvis and so the procedure was terminated. All instrument, needle, and lap counts were correct x 2. The patient was awakened to recovery in stable condition.

## 2012-06-30 NOTE — Anesthesia Preprocedure Evaluation (Addendum)
Anesthesia Evaluation    Airway Mallampati: III TM Distance: >3 FB Neck ROM: full    Dental No notable dental hx. (+) Teeth Intact   Pulmonary neg pulmonary ROS,  breath sounds clear to auscultation  Pulmonary exam normal       Cardiovascular negative cardio ROS  Rhythm:regular Rate:Normal     Neuro/Psych PSYCHIATRIC DISORDERS Depression negative neurological ROS     GI/Hepatic negative GI ROS,   Endo/Other  negative endocrine ROS  Renal/GU negative Renal ROS   HSV    Musculoskeletal negative musculoskeletal ROS (+)   Abdominal Normal abdominal exam  (+)   Peds  Hematology negative hematology ROS (+)   Anesthesia Other Findings   Reproductive/Obstetrics negative OB ROS Pelvic Pain  Dyspareunia Dysmenorrhea                          Anesthesia Physical Anesthesia Plan  ASA: II  Anesthesia Plan: General   Post-op Pain Management:    Induction: Intravenous  Airway Management Planned: Oral ETT  Additional Equipment:   Intra-op Plan:   Post-operative Plan: Extubation in OR  Informed Consent: I have reviewed the patients History and Physical, chart, labs and discussed the procedure including the risks, benefits and alternatives for the proposed anesthesia with the patient or authorized representative who has indicated his/her understanding and acceptance.   Dental advisory given  Plan Discussed with: Anesthesiologist, CRNA and Surgeon  Anesthesia Plan Comments:        Anesthesia Quick Evaluation

## 2012-06-30 NOTE — Telephone Encounter (Signed)
Pts husband called to see if pt had given permission for me to tell him which hospital she is at. No DPR noted. Could not give any info.

## 2012-06-30 NOTE — Anesthesia Postprocedure Evaluation (Signed)
  Anesthesia Post-op Note  Patient: Charlene Garcia  Procedure(s) Performed: Procedure(s) (LRB) with comments: LAPAROSCOPY OPERATIVE (N/A) - with peritoneal biopsy  Patient Location: PACU  Anesthesia Type: General  Level of Consciousness: awake, alert  and oriented  Airway and Oxygen Therapy: Patient Spontanous Breathing  Post-op Pain: none  Post-op Assessment: Post-op Vital signs reviewed, Patient's Cardiovascular Status Stable, Respiratory Function Stable, Patent Airway, Adequate PO intake and Pain level controlled  Post-op Vital Signs: Reviewed and stable  Complications: No apparent anesthesia complications

## 2012-06-30 NOTE — Transfer of Care (Signed)
Immediate Anesthesia Transfer of Care Note  Patient: Charlene Garcia  Procedure(s) Performed: Procedure(s) (LRB) with comments: LAPAROSCOPY OPERATIVE (N/A) - with peritoneal biopsy  Patient Location: PACU  Anesthesia Type: General  Level of Consciousness: sedated  Airway & Oxygen Therapy: Patient Spontanous Breathing  Post-op Assessment: Report given to PACU RN  Post vital signs: Reviewed and stable  Complications: No apparent anesthesia complications

## 2012-06-30 NOTE — H&P (Signed)
  Charlene Garcia is an 26 y.o. G1P1 Unknown female.   Chief Complaint: Pelvic pain HPI: 26 y.o.G1P1 with long h/o of dysmenorrhea, increasing dysparuenia.  For diagnostic laparoscopy.    Past Medical History  Diagnosis Date  . 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 Herpes simplex infection     Inside mouth  . No pertinent past medical history     Past Surgical History  Procedure Date  . Cesarean section     2009  . Wisdom tooth extraction 2013    x1    Family History  Problem Relation Age of Onset  . Heart disease Mother   . Heart disease Brother   . Heart disease Maternal Grandfather    Social History:  reports that she has never smoked. She has never used smokeless tobacco. She reports that she does not drink alcohol or use illicit drugs.  Allergies: No Known Allergies  Medications Prior to Admission  Medication Sig Dispense Refill  . guaiFENesin (ROBITUSSIN) 100 MG/5ML liquid Take 200 mg by mouth 3 (three) times daily as needed. For cough      . chlorpheniramine-HYDROcodone (TUSSIONEX PENNKINETIC ER) 10-8 MG/5ML LQCR Take 5 mLs by mouth at bedtime as needed.  140 mL  0     A comprehensive review of systems was negative.  Blood pressure 116/64, pulse 82, temperature 97.3 F (36.3 C), temperature source Oral, resp. rate 18, height 5\' 4"  (1.626 m), weight 80.74 kg (178 lb), SpO2 99.00%. BP 116/64  Pulse 82  Temp 97.3 F (36.3 C) (Oral)  Resp 18  Ht 5\' 4"  (1.626 m)  Wt 80.74 kg (178 lb)  BMI 30.55 kg/m2  SpO2 99% General appearance: alert, cooperative and appears stated age Head: Normocephalic, without obvious abnormality, atraumatic Neck: no adenopathy, supple, symmetrical, trachea midline and thyroid not enlarged, symmetric, no tenderness/mass/nodules Lungs: clear to auscultation bilaterally Heart: regular rate and rhythm, S1, S2 normal, no murmur, click, rub or gallop Abdomen: soft, non-tender; bowel sounds normal; no masses,  no  organomegaly Extremities: extremities normal, atraumatic, no cyanosis or edema Skin: Skin color, texture, turgor normal. No rashes or lesions Neurologic: Grossly normal   Lab Results  Component Value Date   WBC 7.4 06/25/2012   HGB 12.9 06/25/2012   HCT 38.3 06/25/2012   MCV 86.8 06/25/2012   PLT 203 06/25/2012   Lab Results  Component Value Date   PREGTESTUR NEGATIVE 06/30/2012     Assessment/Plan Patient Active Problem List  Diagnosis  . Depression  . Back pain  . Dyspareunia   Diagnostic laparoscopy.  Charlene Garcia S 06/30/2012, 1:57 PM

## 2012-07-01 ENCOUNTER — Encounter (HOSPITAL_COMMUNITY): Payer: Self-pay | Admitting: Family Medicine

## 2012-07-20 ENCOUNTER — Encounter: Payer: Self-pay | Admitting: Family Medicine

## 2012-07-20 ENCOUNTER — Ambulatory Visit (INDEPENDENT_AMBULATORY_CARE_PROVIDER_SITE_OTHER): Payer: BC Managed Care – PPO | Admitting: Family Medicine

## 2012-07-20 VITALS — BP 104/81 | HR 81 | Ht 64.0 in | Wt 174.0 lb

## 2012-07-20 DIAGNOSIS — R102 Pelvic and perineal pain: Secondary | ICD-10-CM

## 2012-07-20 DIAGNOSIS — Z3041 Encounter for surveillance of contraceptive pills: Secondary | ICD-10-CM

## 2012-07-20 DIAGNOSIS — Z09 Encounter for follow-up examination after completed treatment for conditions other than malignant neoplasm: Secondary | ICD-10-CM

## 2012-07-20 DIAGNOSIS — N949 Unspecified condition associated with female genital organs and menstrual cycle: Secondary | ICD-10-CM

## 2012-07-20 MED ORDER — NORETHINDRONE-ETH ESTRADIOL 1-35 MG-MCG PO TABS: 1.0000 | ORAL_TABLET | Freq: Every day | ORAL | Status: DC

## 2012-07-20 NOTE — Progress Notes (Signed)
  Subjective:    Patient ID: Charlene Garcia, female    DOB: 1986-01-22, 26 y.o.   MRN: [id number]  HPI Her today for post-op s/p dx laparoscopy, which was negative.    Review of Systems  Constitutional: Negative for fever and appetite change.  Gastrointestinal: Negative for abdominal pain.       Objective:   Physical Exam  Constitutional: She appears well-developed and well-nourished.  HENT:  Head: Normocephalic and atraumatic.  Eyes: No scleral icterus.  Cardiovascular: Normal rate.   Pulmonary/Chest: Effort normal.  Abdominal: Soft.  Skin:       Incision is healing well.          Assessment & Plan:   1. Surveillance of previously prescribed contraceptive pill   2. Chronic pelvic pain in female

## 2012-07-20 NOTE — Assessment & Plan Note (Signed)
Improved after IUD removal--path revealed no endometriosis.

## 2012-08-10 ENCOUNTER — Ambulatory Visit (INDEPENDENT_AMBULATORY_CARE_PROVIDER_SITE_OTHER): Payer: BC Managed Care – PPO | Admitting: Family Medicine

## 2012-08-10 VITALS — BP 110/80 | Temp 98.1°F | Wt 177.0 lb

## 2012-08-10 DIAGNOSIS — T8149XA Infection following a procedure, other surgical site, initial encounter: Secondary | ICD-10-CM

## 2012-08-10 DIAGNOSIS — T8140XA Infection following a procedure, unspecified, initial encounter: Secondary | ICD-10-CM

## 2012-08-10 MED ORDER — SULFAMETHOXAZOLE-TRIMETHOPRIM 800-160 MG PO TABS: 1.0000 | ORAL_TABLET | Freq: Two times a day (BID) | ORAL | Status: AC

## 2012-08-10 NOTE — Progress Notes (Signed)
  Subjective:    Patient ID: Charlene Garcia, female    DOB: [birth year], 26 y.o.   MRN: [id number]  HPI 26 yo here for painful knot at her laparoscopy incision site.  S/p dx laparoscopy, which was negative, on 9/18 by Dr. Shawnie Pons.  She felt it was healing well until a week or two ago, became painful and red.  Was draining foul smelling thick discharge a few days ago.  She has been cleaning it with soap and peroxide.  No fevers.  Patient Active Problem List  Diagnosis  . Depression  . Back pain  . Dyspareunia  . Chronic pelvic pain in female   Past Medical History  Diagnosis Date  . 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 Herpes simplex infection     Inside mouth  . No pertinent past medical history    Past Surgical History  Procedure Date  . Cesarean section     2009  . Wisdom tooth extraction 2013    x1  . Laparoscopy 06/30/2012    Procedure: LAPAROSCOPY OPERATIVE;  Surgeon: Reva Bores, MD;  Location: [facility];  Service: Gynecology;  Laterality: N/A;  with peritoneal biopsy   History  Substance Use Topics  . Smoking status: Never Smoker   . Smokeless tobacco: Never Used  . Alcohol Use: No     occasionally   Family History  Problem Relation Age of Onset  . Heart disease Mother   . Heart disease Brother   . Heart disease Maternal Grandfather    No Known Allergies Current Outpatient Prescriptions on File Prior to Visit  Medication Sig Dispense Refill  . norethindrone-ethinyl estradiol 1/35 (ORTHO-NOVUM 1/35, 28,) tablet Take 1 tablet by mouth daily.  1 Package  11   The PMH, PSH, Social History, Family History, Medications, and allergies have been reviewed in [facility], and have been updated if relevant.   Review of Systems  Constitutional: Negative for fever and appetite change.  Gastrointestinal: Negative for abdominal pain.       Objective:   Physical Exam  BP 110/80  Temp 98.1 F (36.7 C) (Oral)  Wt 177 lb (80.287 kg)  LMP  07/12/2012  Constitutional: She appears well-developed and well-nourished.  HENT:  Head: Normocephalic and atraumatic.  Eyes: No scleral icterus.  Cardiovascular: Normal rate.   Pulmonary/Chest: Effort normal.  Abdominal: Soft.  Skin:        Some erythema, tenderness to palpation, induration around incision site at naval, no drainage, no foul odor.         Assessment & Plan:   1.  Painful incision site-  Does appear consistent with irritation vs early cellulitis.  No drainage or tract identified.  Will place on course of bactrim.  Continue to keep area clean and dry. Call or return to clinic prn if these symptoms worsen or fail to improve as anticipated. The patient indicates understanding of these issues and agrees with the plan.

## 2012-08-10 NOTE — Patient Instructions (Addendum)
Good to see you.  Your wound actually looks pretty good.  Keep cleaning it, try to keep it dry.  Take bactrim as directed- 1 tablet twice daily for 10 days.  Call me next week if no better.

## 2012-09-01 ENCOUNTER — Ambulatory Visit (INDEPENDENT_AMBULATORY_CARE_PROVIDER_SITE_OTHER): Payer: BC Managed Care – PPO | Admitting: Family Medicine

## 2012-09-01 ENCOUNTER — Encounter: Payer: Self-pay | Admitting: Family Medicine

## 2012-09-01 VITALS — BP 118/79 | HR 102 | Ht 64.0 in | Wt 178.0 lb

## 2012-09-01 DIAGNOSIS — Z3009 Encounter for other general counseling and advice on contraception: Secondary | ICD-10-CM

## 2012-09-01 DIAGNOSIS — Z309 Encounter for contraceptive management, unspecified: Secondary | ICD-10-CM

## 2012-09-01 NOTE — Patient Instructions (Signed)

## 2012-09-01 NOTE — Assessment & Plan Note (Addendum)
To return with Cycle and neg UPT for Mirena insertion

## 2012-09-01 NOTE — Progress Notes (Signed)
  Subjective:    Patient ID: Charlene Garcia, female    DOB: 1986-03-10, 26 y.o.   MRN: [id number]  HPI Previously reported pelvic pain.  Had IUD removed for this, and pain improved.  Laparoscopy revealed no abnormality.  Tried OC's.  Reported swelling in hands and feet.  LMP 11/1.  Has had unprotected intercourse.Desires replacement of IUD for cycle and birth control.   Review of Systems  Constitutional: Negative for fever and chills.  Gastrointestinal: Negative for abdominal pain.  Genitourinary: Negative for vaginal bleeding and vaginal discharge.       Objective:   Physical Exam  Vitals reviewed. Constitutional: She appears well-developed and well-nourished.  HENT:  Head: Normocephalic and atraumatic.  Eyes: No scleral icterus.  Neck: Neck supple.  Cardiovascular: Normal rate.   Pulmonary/Chest: Effort normal.  Abdominal: Soft.          Assessment & Plan:

## 2012-09-21 ENCOUNTER — Ambulatory Visit: Payer: BC Managed Care – PPO | Admitting: Obstetrics & Gynecology

## 2012-09-24 ENCOUNTER — Ambulatory Visit: Payer: BC Managed Care – PPO | Admitting: Family Medicine

## 2012-09-29 ENCOUNTER — Encounter: Payer: Self-pay | Admitting: Obstetrics & Gynecology

## 2012-09-29 ENCOUNTER — Ambulatory Visit (INDEPENDENT_AMBULATORY_CARE_PROVIDER_SITE_OTHER): Payer: BC Managed Care – PPO | Admitting: Obstetrics & Gynecology

## 2012-09-29 VITALS — BP 114/81 | HR 85 | Ht 64.0 in | Wt 179.0 lb

## 2012-09-29 DIAGNOSIS — Z30430 Encounter for insertion of intrauterine contraceptive device: Secondary | ICD-10-CM

## 2012-09-29 DIAGNOSIS — Z01812 Encounter for preprocedural laboratory examination: Secondary | ICD-10-CM

## 2012-09-29 LAB — POCT URINE PREGNANCY: Preg Test, Ur: NEGATIVE

## 2012-09-29 MED ORDER — LEVONORGESTREL 20 MCG/24HR IU IUD: INTRAUTERINE_SYSTEM | Freq: Once | INTRAUTERINE | Status: DC

## 2012-09-29 NOTE — Progress Notes (Addendum)
DEMETRICA ZIPP is a 26 y.o. G1P1 here for Mirena IUD insertion. No GYN concerns.  Last pap smear was on 10/27/11 and was normal but had positive HRHPV, will need to be repeated in 10/2012.  IUD Procedure Note Patient identified, informed consent performed.  Discussed risks of irregular bleeding, cramping, infection, malpositioning or misplacement of the IUD outside the uterus which may require further procedures. Time out was performed.  Urine pregnancy test negative.  Speculum placed in the vagina.  Cervix visualized.  Cleaned with Betadine x 2.  Grasped anteriorly with a single tooth tenaculum.  Uterus sounded to 10 cm.  Mirena IUD placed per manufacturer's recommendations.  Strings trimmed to 2 cm. Tenaculum was removed, good hemostasis noted.  Patient tolerated procedure well.   Patient was given post-procedure instructions.  Patient was also asked to check IUD strings periodically and follow up in 4 weeks for IUD check and annual exam/pap smear.

## 2012-09-29 NOTE — Patient Instructions (Signed)
Intrauterine Device Insertion Care After Refer to this sheet in the next few weeks. These instructions provide you with information on caring for yourself after your procedure. Your caregiver may also give you more specific instructions. Your treatment has been planned according to current medical practices, but problems sometimes occur. Call your caregiver if you have any problems or questions after your procedure. HOME CARE INSTRUCTIONS   Only take over-the-counter or prescription medicines for pain, discomfort, or fever as directed by your caregiver. Do not use aspirin. This may increase bleeding.  Check your IUD to make sure it is in place before you resume sexual activity. You should be able to feel the strings. If you cannot feel the strings, something may be wrong. The IUD may have fallen out of the uterus, or the uterus may have been punctured (perforated) during placement. Also, if the strings are getting longer, it may mean that the IUD is being forced out of the uterus. You no longer have full protection from pregnancy if any of these problems occur.  You may resume sexual intercourse if you are not having problems with the IUD. The IUD is considered immediately effective.  You may resume normal activities.  Keep all follow-up appointments to be sure your IUD has remained in place. After the first exam, yearly exams are advised, unless you cannot feel the strings of your IUD.  Continue to check that the IUD is still in place by feeling for the strings after every menstrual period. SEEK MEDICAL CARE IF:   You have bleeding that is heavier or lasts longer than a normal menstrual cycle.  You have a fever.  You have increasing cramps or abdominal pain not relieved with medicine.  You have abdominal pain that does not seem to be related to the same area of earlier cramping and pain.  You are lightheaded, unusually weak, or faint.  You have abnormal vaginal discharge or  smells.  You have pain during sexual intercourse.  You cannot feel the IUD strings, or the IUD string has gotten longer.  You feel the IUD at the opening of the cervix in the vagina.  You think you are pregnant, or you miss your menstrual period.  The IUD string is hurting your sex partner. Document Released: 05/28/2011 Document Revised: 12/22/2011 Document Reviewed: 05/28/2011 [employer] Patient Information 2013 [employer].  

## 2012-11-02 ENCOUNTER — Ambulatory Visit: Payer: BC Managed Care – PPO | Admitting: Family Medicine

## 2012-11-02 DIAGNOSIS — Z01419 Encounter for gynecological examination (general) (routine) without abnormal findings: Secondary | ICD-10-CM

## 2012-11-22 ENCOUNTER — Ambulatory Visit (INDEPENDENT_AMBULATORY_CARE_PROVIDER_SITE_OTHER)
Admission: RE | Admit: 2012-11-22 | Discharge: 2012-11-22 | Disposition: A | Discharge status: Home / Self Care | Payer: BC Managed Care – PPO | Source: Ambulatory Visit | Attending: Family Medicine | Admitting: Family Medicine

## 2012-11-22 ENCOUNTER — Ambulatory Visit (INDEPENDENT_AMBULATORY_CARE_PROVIDER_SITE_OTHER): Payer: BC Managed Care – PPO | Admitting: Family Medicine

## 2012-11-22 ENCOUNTER — Encounter: Payer: Self-pay | Admitting: Family Medicine

## 2012-11-22 VITALS — BP 110/62 | HR 83 | Temp 98.2°F | Ht 64.0 in | Wt 178.8 lb

## 2012-11-22 DIAGNOSIS — M79645 Pain in left finger(s): Secondary | ICD-10-CM

## 2012-11-22 DIAGNOSIS — M79609 Pain in unspecified limb: Secondary | ICD-10-CM

## 2012-11-22 NOTE — Progress Notes (Signed)
Nature conservation officer at [facility] [address] [place] [address] Phone: [phone] Fax: [phone]  Date:  11/22/2012   Name:  Charlene Garcia   DOB:  04-23-1986   MRN:  [id number] Gender: female Age: 27 y.o.  Primary Physician:  Ruthe Mannan, MD  Evaluating MD: Hannah Beat, MD   Chief Complaint: Hand Pain   History of Present Illness:  Charlene Garcia is a 27 y.o. pleasant patient who presents with the following:  L 5th finger. Has been splinted. She made a splint on her own with a popsicle stick. Bruising, mostly around the PIP. Hyperextension injury.   Patient Active Problem List  Diagnosis  . Depression  . Back pain  . Dyspareunia  . Chronic pelvic pain in female  . Contraception management    Past Medical History  Diagnosis Date  . 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 Herpes simplex infection     Inside mouth  . No pertinent past medical history     Past Surgical History  Procedure Laterality Date  . Cesarean section      2009  . Wisdom tooth extraction  2013    x1  . Laparoscopy  06/30/2012    Procedure: LAPAROSCOPY OPERATIVE;  Surgeon: Reva Bores, MD;  Location: [facility];  Service: Gynecology;  Laterality: N/A;  with peritoneal biopsy    History  Substance Use Topics  . Smoking status: Never Smoker   . Smokeless tobacco: Never Used  . Alcohol Use: No     Comment: occasionally    Family History  Problem Relation Age of Onset  . Heart disease Mother   . Heart disease Brother   . Heart disease Maternal Grandfather     Allergies  Allergen Reactions  . Ortho-Novum 1-35 (28) (Norethin-Eth Estrad Triphasic) Swelling    Medication list has been reviewed and updated.  No outpatient prescriptions prior to visit.   No facility-administered medications prior to visit.    Review of Systems:   GEN: No fevers, chills. Nontoxic. Primarily MSK c/o today. MSK: Detailed in the HPI GI: tolerating PO intake without  difficulty Neuro: No numbness, parasthesias, or tingling associated. Otherwise the pertinent positives of the ROS are noted above.    Physical Examination: BP 110/62  Pulse 83  Temp(Src) 98.2 F (36.8 C) (Oral)  Ht 5\' 4"  (1.626 m)  Wt 178 lb 12 oz (81.08 kg)  BMI 30.67 kg/m2  SpO2 96%  Ideal Body Weight: Weight in (lb) to have BMI = 25: 145.3   GEN: WDWN, NAD, Non-toxic, Alert & Oriented x 3 HEENT: Atraumatic, Normocephalic.  Ears and Nose: No external deformity. EXTR: No clubbing/cyanosis/edema NEURO: Normal gait.  PSYCH: Normally interactive. Conversant. Not depressed or anxious appearing.  Calm demeanor.   L hand Ecchymosis or edema: bruising, 5th around PIP ROM wrist/hand/digits: restricted at PIP Carpals, MCP's, digits: NT Distal Ulna and Radius: NT Ecchymosis or edema: neg No instability Cysts/nodules: neg Digit triggering: neg Finkelstein's test: neg Snuffbox tenderness: neg Scaphoid tubercle: NT Resisted supination: NT Full composite fist, no malrotation Grip, all digits: 5/5 str DIPJT: NT PIP JT: NT MCP JT: NT No tenosynovitis Axial load test: neg Atrophy: neg  Hand sensation: intact   Dg Finger Little Left  11/22/2012  *RADIOLOGY REPORT*  Clinical Data: Left fifth finger pain.  LEFT LITTLE FINGER 2+V  Comparison: None.  Findings: No fracture or dislocation.  No bony destructive lesion.  IMPRESSION: Negative.  Original Report Authenticated By: Lacy Duverney, M.D.     Assessment and Plan: 1. Finger pain, left  DG Finger Little Left   DG Finger Little Left    Reassured. Buddy taping ok. May have injured volar plate.  Signed, Elpidio Galea. Sequoyah Ramone, MD 11/22/2012 3:36 PM

## 2013-03-03 ENCOUNTER — Encounter: Payer: Self-pay | Admitting: Family Medicine

## 2013-03-03 ENCOUNTER — Ambulatory Visit (INDEPENDENT_AMBULATORY_CARE_PROVIDER_SITE_OTHER): Payer: Self-pay | Admitting: Family Medicine

## 2013-03-03 VITALS — BP 110/80 | HR 72 | Temp 97.4°F | Wt 178.0 lb

## 2013-03-03 DIAGNOSIS — Z30432 Encounter for removal of intrauterine contraceptive device: Secondary | ICD-10-CM

## 2013-03-03 NOTE — Progress Notes (Signed)
IUD Removal Procedure Note  Pre-operative Diagnosis: Desires fertility  Patient Active Problem List   Diagnosis Date Noted  . Contraception management 09/01/2012  . Chronic pelvic pain in female 06/30/2012  . Dyspareunia 05/14/2012  . Back pain 03/15/2012  . Depression 10/27/2011   Past Medical History  Diagnosis Date  . 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 Herpes simplex infection     Inside mouth  . No pertinent past medical history    Past Surgical History  Procedure Laterality Date  . Cesarean section      2009  . Wisdom tooth extraction  2013    x1  . Laparoscopy  06/30/2012    Procedure: LAPAROSCOPY OPERATIVE;  Surgeon: Reva Bores, MD;  Location: [facility];  Service: Gynecology;  Laterality: N/A;  with peritoneal biopsy   History  Substance Use Topics  . Smoking status: Never Smoker   . Smokeless tobacco: Never Used  . Alcohol Use: No     Comment: occasionally   Family History  Problem Relation Age of Onset  . Heart disease Mother   . Heart disease Brother   . Heart disease Maternal Grandfather    Allergies  Allergen Reactions  . Ortho-Novum 1-35 (28) (Norethin-Eth Estrad Triphasic) Swelling   No current outpatient prescriptions on file prior to visit.   No current facility-administered medications on file prior to visit.   The PMH, PSH, Social History, Family History, Medications, and allergies have been reviewed in [facility], and have been updated if relevant.  Post-operative Diagnosis: normal  Indications: reverse conception  Procedure Details   BP 110/80  Pulse 72  Temp(Src) 97.4 F (36.3 C)  Wt 178 lb (80.74 kg)  BMI 30.54 kg/m2  .  The risks (including infection, bleeding, pain, and uterine perforation) and benefits of the procedure were explained to the patient and Verbal informed consent was obtained.    Cervix cleansed with Betadine. IUD removed with sterile foreceps without difficulty.. Patient tolerated procedure  well.  Condition: Stable  Complications: None  Plan:  The patient was advised to call for any fever or for prolonged or severe pain or bleeding. She was advised to use NSAID as needed for mild to moderate pain.

## 2013-09-16 IMAGING — US US PELV - US TRANSVAGINAL
1 series · 14 of 25 positions shown · non-contrast
Comparison: none

REASON FOR EXAM: pelvic pain  abd cramping
COMMENTS:

PROCEDURE:     US  - US PELVIS EXAM W/TRANSVAGINAL  - March 26, 2012  [DATE]
RESULT:

[Series 1: us pelv - us transvaginal · 0.33mm/px · 14 of 56 slices shown]
[im 1/56]
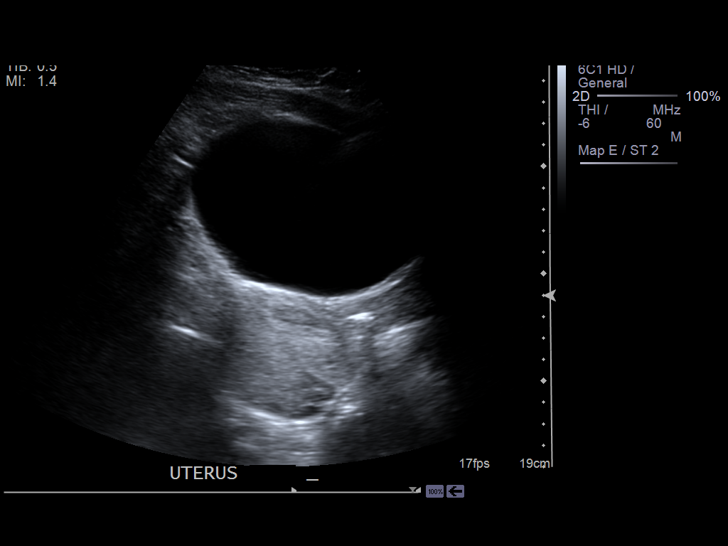
[im 5/56]
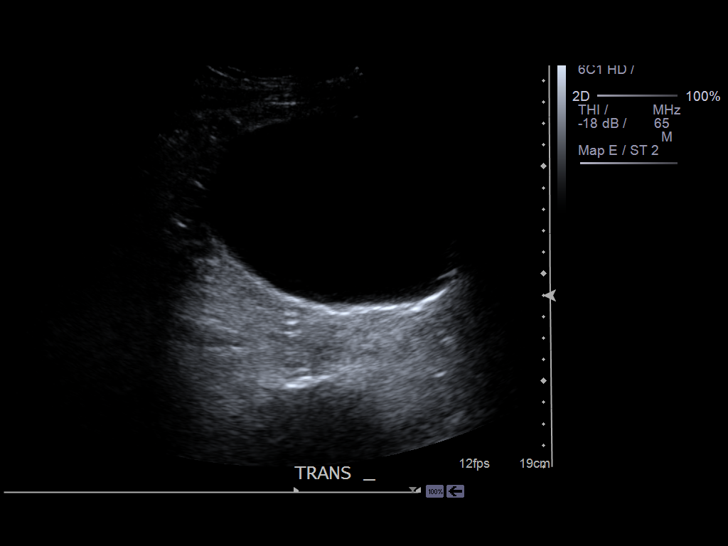
[im 10/56]
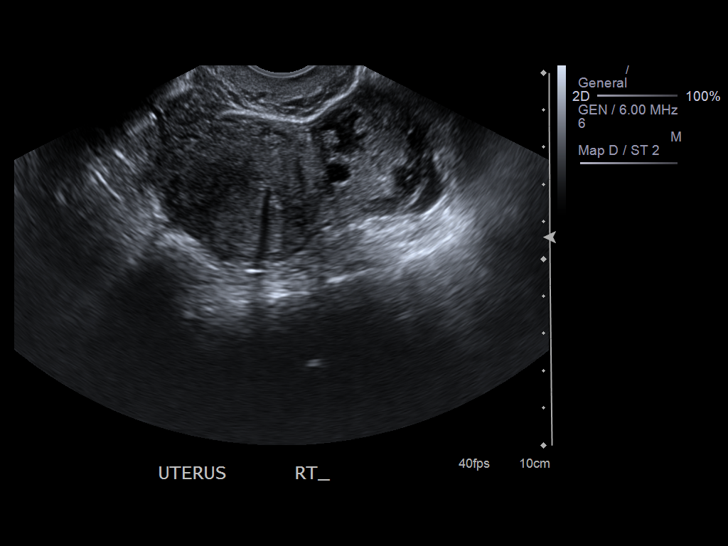
[im 14/56]
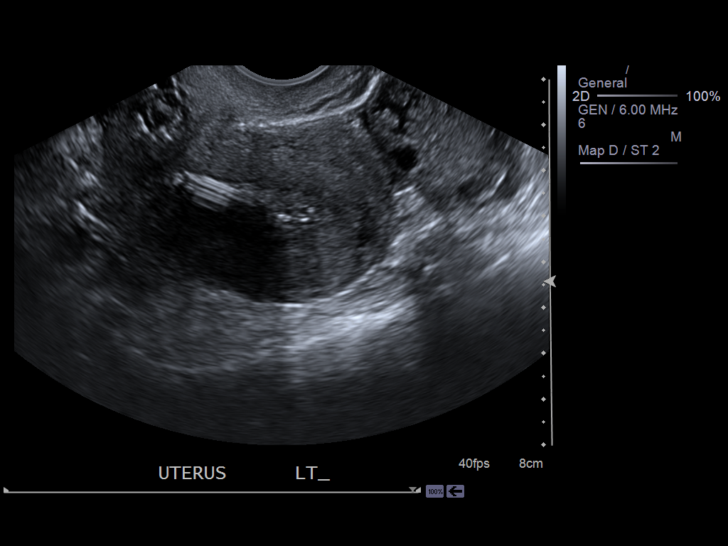
[im 19/56]
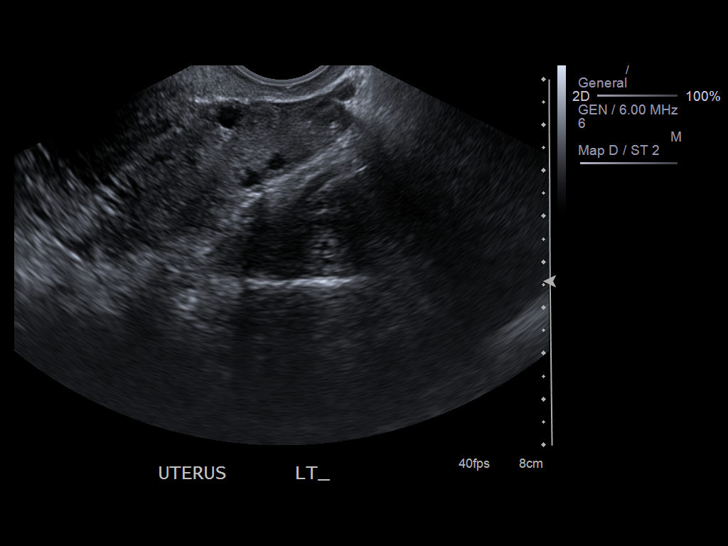
[im 21/56]
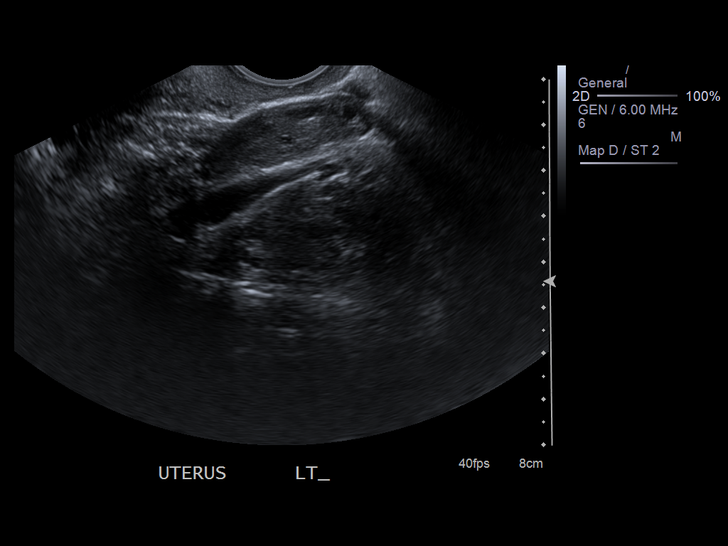
[im 26/56]
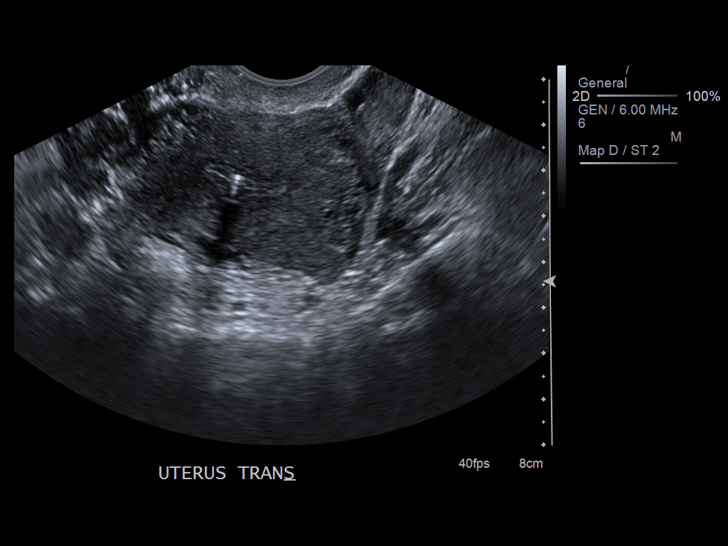
[im 30/56]
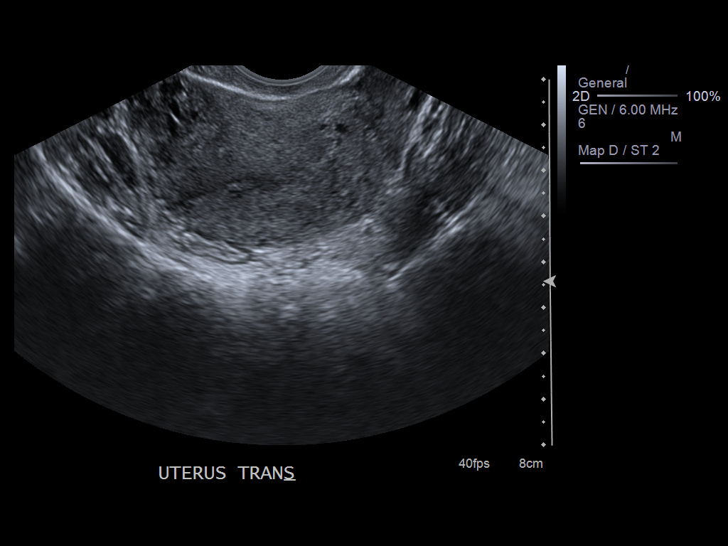
[im 35/56]
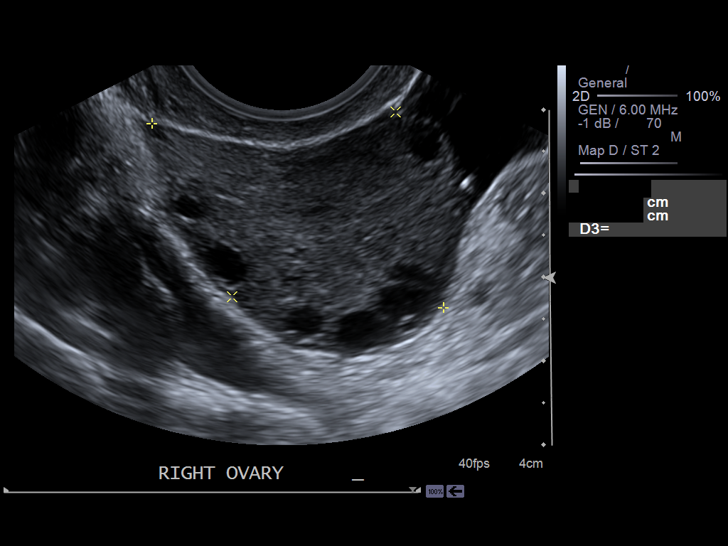
[im 37/56]
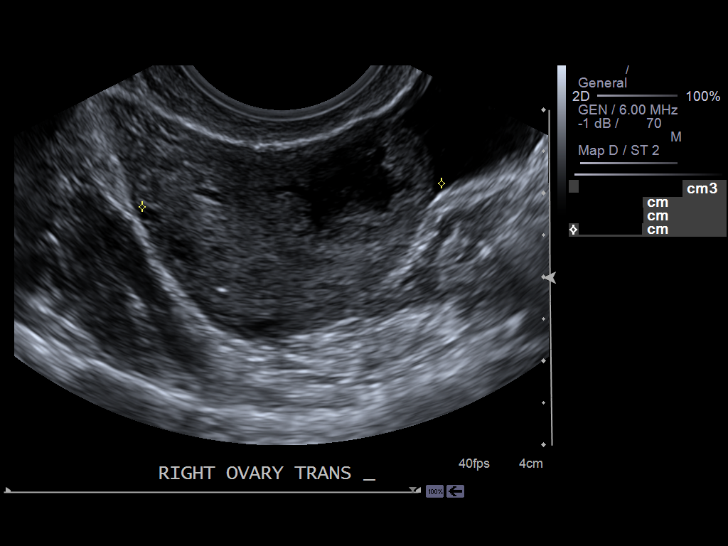
[im 42/56]
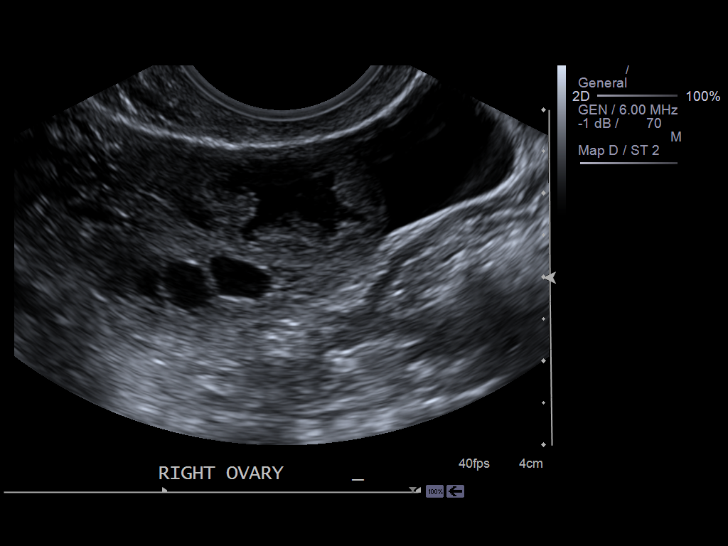
[im 46/56]
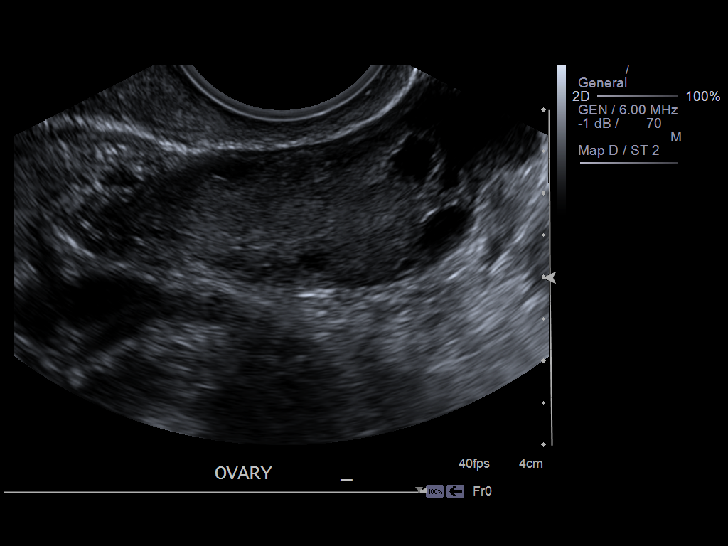
[im 51/56]
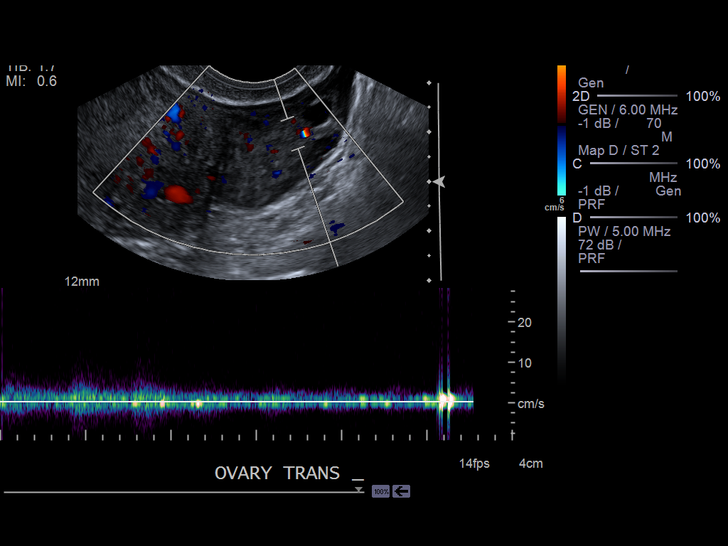
[im 56/56]
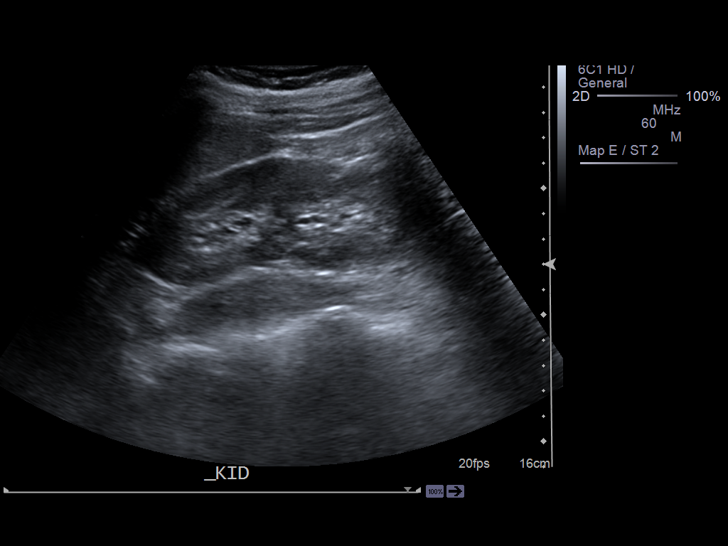

[14 of 25 positions shown; findings below may reference images not displayed]

FINDINGS: The uterus is retroverted demonstrating a homogeneous echotexture
and endometrial thickness of 4.8 millimeters. An intrauterine device is
appreciated. The uterus is otherwise unremarkable. The right ovary measures
4.1 x 2.9 x 3.5 cm and the left 4 x 1.8 x 2.6 cm. Color flow is identified
within the ovaries as well as ovarian follicles. An anechoic structure is
identified within the right ovary likely representing partially collapsed or
involuting cyst measuring 1.2 cm in diameter. There is no evidence of pelvic
free fluid or loculated fluid collections. There is no evidence of
hydronephrosis. Color-flow and arterial and venous waveforms are identified
within the right and left ovaries. A small amount of fluid is identified
within the cul-de-sac. No further evidence of ascites. The fluid is likely
physiologic.
IMPRESSION: Unremarkable pelvic ultrasound.

## 2014-01-31 ENCOUNTER — Ambulatory Visit (INDEPENDENT_AMBULATORY_CARE_PROVIDER_SITE_OTHER): Payer: BC Managed Care – PPO | Admitting: Family Medicine

## 2014-01-31 ENCOUNTER — Encounter: Payer: Self-pay | Admitting: Family Medicine

## 2014-01-31 VITALS — BP 138/87 | HR 87 | Ht 63.0 in | Wt 182.0 lb

## 2014-01-31 DIAGNOSIS — N926 Irregular menstruation, unspecified: Secondary | ICD-10-CM

## 2014-01-31 DIAGNOSIS — N912 Amenorrhea, unspecified: Secondary | ICD-10-CM

## 2014-01-31 LAB — POCT URINE PREGNANCY: PREG TEST UR: NEGATIVE

## 2014-01-31 MED ORDER — MEDROXYPROGESTERONE ACETATE 10 MG PO TABS: 10.0000 mg | ORAL_TABLET | Freq: Every day | ORAL | Status: DC

## 2014-01-31 NOTE — Assessment & Plan Note (Signed)
Will w/u with TSH, FSH, PRL.  Trial of Provera.

## 2014-01-31 NOTE — Patient Instructions (Signed)
Secondary Amenorrhea   Secondary amenorrhea is the stopping of menstrual flow for 3 6 months in a female who has previously had periods. There are many possible causes. Most of these causes are not serious. Usually, treating the underlying problem causing the loss of menses will return your periods to normal.  CAUSES   Some common and uncommon causes of not menstruating include:   Malnutrition.   Low blood sugar (hypoglycemia).   Polycystic ovary disease.   Stress or fear.   Breastfeeding.   Hormone imbalance.   Ovarian failure.   Medicines.   Extreme obesity.   Cystic fibrosis.   Low body weight or drastic weight reduction from any cause.   Early menopause.   Removal of ovaries or uterus.   Contraceptives.   Illness.   Long-term (chronic) illnesses.   Cushing syndrome.   Thyroid problems.   Birth control pills, patches, or vaginal rings for birth control.  RISK FACTORS  You may be at greater risk of secondary amenorrhea if:   You have a family history of this condition.   You have an eating disorder.   You do athletic training.  DIAGNOSIS   A diagnosis is made by your health care provider taking a medical history and doing a physical exam. This will include a pelvic exam to check for problems with your reproductive organs. Pregnancy must be ruled out. Often, numerous blood tests are done to measure different hormones in the body. Urine testing may be done. Specialized exams (ultrasound, CT scan, MRI, or hysteroscopy) may have to be done as well as measuring the body mass index (BMI).  TREATMENT   Treatment depends on the cause of the amenorrhea. If an eating disorder is present, this can be treated with an adequate diet and therapy. Chronic illnesses may improve with treatment of the illness. Amenorrhea may be corrected with medicines, lifestyle changes, or surgery. If the amenorrhea cannot be corrected, it is sometimes possible to create a false menstruation with medicines.  HOME CARE  INSTRUCTIONS   Maintain a healthy diet.   Manage weight problems.   Exercise regularly but not excessively.   Get adequate sleep.   Manage stress.   Be aware of changes in your menstrual cycle. Keep a record of when your periods occur. Note the date your period starts, how long it lasts, and any problems.  SEEK MEDICAL CARE IF:  Your symptoms do not get better with treatment.  Document Released: 11/10/2006 Document Revised: 06/01/2013 Document Reviewed: 03/17/2013  [employer] Patient Information 2014 [employer].

## 2014-01-31 NOTE — Progress Notes (Signed)
Patient called to ask that we call her medication into a different pharmacy for convenience of her picking it up.  Medicine called to [facility] in [place].

## 2014-01-31 NOTE — Progress Notes (Signed)
    Subjective:    Patient ID: Charlene Garcia is a 28 y.o. female presenting with Menstrual Problem  on 01/31/2014  HPI: LMP is 8 wks ago.  No h/o irregular cycles.  No abrupt weight changes, some clear breast discharge. Has heavy menses that last 3 d.  Recently separated.  Home pregnancy tests negative.  UPT here negative.  Review of Systems  Constitutional: Negative for unexpected weight change.  Gastrointestinal: Negative for diarrhea and constipation.  Endocrine: Negative for cold intolerance and heat intolerance.      Objective:    BP 138/87  Pulse 87  Ht 5\' 3"  (1.6 m)  Wt 182 lb (82.555 kg)  BMI 32.25 kg/m2 Physical Exam  Vitals reviewed. Constitutional: She appears well-developed and well-nourished.  Pulmonary/Chest: Effort normal.  Abdominal: Soft. There is no tenderness.  Genitourinary: Vagina normal and uterus normal. No vaginal discharge found.  Neurological: She is alert.  Skin: Skin is warm.        Assessment & Plan:  Irregular menses Will w/u with TSH, FSH, PRL.  Trial of Provera.     Return in about 3 months (around 05/02/2014) for a follow-up.

## 2014-02-01 LAB — PROLACTIN: Prolactin: 10.1 ng/mL

## 2014-02-01 LAB — FOLLICLE STIMULATING HORMONE: FSH: 7 m[IU]/mL

## 2014-02-01 LAB — TSH: TSH: 2.827 u[IU]/mL (ref 0.350–4.500)

## 2014-02-07 ENCOUNTER — Telehealth: Payer: Self-pay | Admitting: *Deleted

## 2014-02-07 NOTE — Telephone Encounter (Signed)
Message copied by Erik Obey on Tue Feb 07, 2014  4:37 PM ------      Message from: Donnamae Jude      Created: Wed Feb 01, 2014  4:21 AM       Labs look normal--please inform pt. ------

## 2014-02-07 NOTE — Telephone Encounter (Signed)
Patient was called and notified lab results are within normal limits.

## 2014-02-23 ENCOUNTER — Ambulatory Visit: Payer: BC Managed Care – PPO | Admitting: Internal Medicine

## 2014-02-23 ENCOUNTER — Encounter: Payer: Self-pay | Admitting: Internal Medicine

## 2014-02-23 ENCOUNTER — Ambulatory Visit (INDEPENDENT_AMBULATORY_CARE_PROVIDER_SITE_OTHER): Payer: BC Managed Care – PPO | Admitting: Internal Medicine

## 2014-02-23 VITALS — BP 114/68 | HR 105 | Temp 99.2°F | Wt 184.5 lb

## 2014-02-23 DIAGNOSIS — J029 Acute pharyngitis, unspecified: Secondary | ICD-10-CM

## 2014-02-23 DIAGNOSIS — J069 Acute upper respiratory infection, unspecified: Secondary | ICD-10-CM

## 2014-02-23 DIAGNOSIS — H9209 Otalgia, unspecified ear: Secondary | ICD-10-CM

## 2014-02-23 MED ORDER — METHYLPREDNISOLONE ACETATE 80 MG/ML IJ SUSP
80.0000 mg | Freq: Once | INTRAMUSCULAR | Status: AC
  Administered 2014-02-23: 80 mg via INTRAMUSCULAR

## 2014-02-23 NOTE — Progress Notes (Signed)
Subjective:    Patient ID: Charlene Garcia, female    DOB: 1986/02/03, 28 y.o.   MRN: [id number]  HPI  Pt presents to the clinic today with c/o sore throat, ear pain and body aches. She reports this started 1 day ago. She has had some difficulty swallowing and reports her throat feels swollen. She has been running low grade fevers. She has tried. She has no history of allergies or asthma. She has not had sick contacts that she is aware of.  Review of Systems  Past Medical History  Diagnosis Date  . 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 Herpes simplex infection     Inside mouth  . No pertinent past medical history     No current outpatient prescriptions on file.   No current facility-administered medications for this visit.    Allergies  Allergen Reactions  . Ortho-Novum 1-35 (28) [Norethin-Eth Estrad Triphasic] Swelling    Family History  Problem Relation Age of Onset  . Heart disease Mother   . Heart disease Brother   . Heart disease Maternal Grandfather     History   Social History  . Marital Status: Legally Separated    Spouse Name: N/A    Number of Children: N/A  . Years of Education: N/A   Occupational History  . Not on file.   Social History Main Topics  . Smoking status: Never Smoker   . Smokeless tobacco: Never Used  . Alcohol Use: No     Comment: occasionally  . Drug Use: No  . Sexual Activity: Yes    Partners: Male    Birth Control/ Protection: None   Other Topics Concern  . Not on file   Social History Narrative  . No narrative on file     Constitutional: Denies fever, malaise, fatigue, headache or abrupt weight changes.  HEENT: Pt reports sore throat and ear pain. Denies eye pain, eye redness, ringing in the ears, wax buildup, runny nose, nasal congestion, bloody nos. Respiratory: Denies difficulty breathing, shortness of breath, cough or sputum production.   Cardiovascular: Denies chest pain, chest tightness, palpitations or  swelling in the hands or feet.   No other specific complaints in a complete review of systems (except as listed in HPI above).     Objective:   Physical Exam   BP 114/68  Pulse 105  Temp(Src) 99.2 F (37.3 C) (Oral)  Wt 184 lb 8 oz (83.689 kg)  SpO2 98% Wt Readings from Last 3 Encounters:  02/23/14 184 lb 8 oz (83.689 kg)  01/31/14 182 lb (82.555 kg)  03/03/13 178 lb (80.74 kg)    General: Appears her stated age, well developed, well nourished in NAD. HEENT: Head: normal shape and size; Eyes: sclera white, no icterus, conjunctiva pink, PERRLA and EOMs intact; Ears: Tm's gray and intact, normal light reflex; Nose: mucosa pink and moist, septum midline; Throat/Mouth: Teeth present, mucosa erythematous and moist, no exudate, lesions or ulcerations noted.  Cardiovascular: Normal rate and rhythm. S1,S2 noted.  No murmur, rubs or gallops noted. No JVD or BLE edema. No carotid bruits noted. Pulmonary/Chest: Normal effort and positive vesicular breath sounds. No respiratory distress. No wheezes, rales or ronchi noted.    CBC    Component Value Date/Time   WBC 7.4 06/25/2012 1404   RBC 4.41 06/25/2012 1404   HGB 12.9 06/25/2012 1404   HCT 38.3 06/25/2012 1404   PLT 203 06/25/2012 1404   MCV 86.8 06/25/2012  [place] 29.3 06/25/2012 1404   MCHC 33.7 06/25/2012 1404   RDW 13.2 06/25/2012 1404          Assessment & Plan:   Sore throat and otalgia secondary to viral URI:  RST: negative Advised her to try ibuprofen for fever, pain, inflammation and body aches Salt water gargles also may be helpful 80 mg Depo IM today  RTC as needed

## 2014-02-23 NOTE — Addendum Note (Signed)
Addended by: Lurlean Nanny on: 02/23/2014 11:50 AM   Modules accepted: Orders

## 2014-02-23 NOTE — Patient Instructions (Addendum)
Sore Throat A sore throat is pain, burning, irritation, or scratchiness of the throat. There is often pain or tenderness when swallowing or talking. A sore throat may be accompanied by other symptoms, such as coughing, sneezing, fever, and swollen neck glands. A sore throat is often the first sign of another sickness, such as a cold, flu, strep throat, or mononucleosis (commonly known as mono). Most sore throats go away without medical treatment. CAUSES  The most common causes of a sore throat include:  A viral infection, such as a cold, flu, or mono.  A bacterial infection, such as strep throat, tonsillitis, or whooping cough.  Seasonal allergies.  Dryness in the air.  Irritants, such as smoke or pollution.  Gastroesophageal reflux disease (GERD). HOME CARE INSTRUCTIONS   Only take over-the-counter medicines as directed by your caregiver.  Drink enough fluids to keep your urine clear or pale yellow.  Rest as needed.  Try using throat sprays, lozenges, or sucking on hard candy to ease any pain (if older than 4 years or as directed).  Sip warm liquids, such as broth, herbal tea, or warm water with honey to relieve pain temporarily. You may also eat or drink cold or frozen liquids such as frozen ice pops.  Gargle with salt water (mix 1 tsp salt with 8 oz of water).  Do not smoke and avoid secondhand smoke.  Put a cool-mist humidifier in your bedroom at night to moisten the air. You can also turn on a hot shower and sit in the bathroom with the door closed for 5 10 minutes. SEEK IMMEDIATE MEDICAL CARE IF:  You have difficulty breathing.  You are unable to swallow fluids, soft foods, or your saliva.  You have increased swelling in the throat.  Your sore throat does not get better in 7 days.  You have nausea and vomiting.  You have a fever or persistent symptoms for more than 2 3 days.  You have a fever and your symptoms suddenly get worse. MAKE SURE YOU:   Understand  these instructions.  Will watch your condition.  Will get help right away if you are not doing well or get worse. Document Released: 11/06/2004 Document Revised: 09/15/2012 Document Reviewed: 06/06/2012 Exit[employer] Patient Information 2014 [employer].  

## 2014-02-23 NOTE — Progress Notes (Signed)
Pre visit review using our clinic review tool, if applicable. No additional management support is needed unless otherwise documented below in the visit note. 

## 2014-02-28 ENCOUNTER — Ambulatory Visit: Payer: BC Managed Care – PPO | Admitting: Internal Medicine

## 2014-08-14 ENCOUNTER — Encounter: Payer: Self-pay | Admitting: Internal Medicine

## 2015-02-22 ENCOUNTER — Other Ambulatory Visit: Payer: Self-pay | Admitting: Family Medicine

## 2015-02-22 DIAGNOSIS — R590 Localized enlarged lymph nodes: Secondary | ICD-10-CM

## 2015-02-27 ENCOUNTER — Ambulatory Visit
Admission: RE | Admit: 2015-02-27 | Discharge: 2015-02-27 | Disposition: A | Discharge status: Home / Self Care | Payer: 59 | Source: Ambulatory Visit | Attending: Family Medicine | Admitting: Family Medicine

## 2015-02-27 DIAGNOSIS — R590 Localized enlarged lymph nodes: Secondary | ICD-10-CM | POA: Diagnosis not present

## 2015-02-27 DIAGNOSIS — Z823 Family history of stroke: Secondary | ICD-10-CM | POA: Diagnosis not present

## 2015-02-27 DIAGNOSIS — Z833 Family history of diabetes mellitus: Secondary | ICD-10-CM | POA: Insufficient documentation

## 2015-02-27 DIAGNOSIS — Z8249 Family history of ischemic heart disease and other diseases of the circulatory system: Secondary | ICD-10-CM | POA: Insufficient documentation

## 2015-03-02 ENCOUNTER — Other Ambulatory Visit: Payer: Self-pay | Admitting: Family Medicine

## 2015-03-02 DIAGNOSIS — R2231 Localized swelling, mass and lump, right upper limb: Secondary | ICD-10-CM

## 2015-03-03 ENCOUNTER — Ambulatory Visit
Admission: RE | Admit: 2015-03-03 | Discharge: 2015-03-03 | Disposition: A | Discharge status: Home / Self Care | Payer: 59 | Source: Ambulatory Visit | Attending: Family Medicine | Admitting: Family Medicine

## 2015-03-03 DIAGNOSIS — R2231 Localized swelling, mass and lump, right upper limb: Secondary | ICD-10-CM | POA: Diagnosis present

## 2015-03-03 MED ORDER — GADOBENATE DIMEGLUMINE 529 MG/ML IV SOLN
20.0000 mL | Freq: Once | INTRAVENOUS | Status: AC | PRN
  Administered 2015-03-03: 17 mL via INTRAVENOUS

## 2016-07-17 ENCOUNTER — Encounter: Payer: Self-pay | Admitting: *Deleted

## 2016-07-17 ENCOUNTER — Ambulatory Visit
Admission: EM | Admit: 2016-07-17 | Discharge: 2016-07-17 | Disposition: A | Discharge status: Home / Self Care | Payer: Worker's Compensation | Attending: Family Medicine | Admitting: Family Medicine

## 2016-07-17 ENCOUNTER — Ambulatory Visit (INDEPENDENT_AMBULATORY_CARE_PROVIDER_SITE_OTHER): Payer: Worker's Compensation

## 2016-07-17 DIAGNOSIS — S61011A Laceration without foreign body of right thumb without damage to nail, initial encounter: Secondary | ICD-10-CM

## 2016-07-17 DIAGNOSIS — S6992XA Unspecified injury of left wrist, hand and finger(s), initial encounter: Secondary | ICD-10-CM

## 2016-07-17 MED ORDER — MUPIROCIN 2 % EX OINT: 1.0000 "application " | TOPICAL_OINTMENT | Freq: Three times a day (TID) | CUTANEOUS | 0 refills | Status: DC

## 2016-07-17 NOTE — ED Provider Notes (Addendum)
[facility]    CSN: [id number] Arrival date & time: 07/17/16  1642     History   Chief Complaint Chief Complaint  Patient presents with  . Laceration    HPI Charlene Garcia is a 30 y.o. female.   She is here because of injury to left thumb she reports file cabinet hit her left thumb when she was doing some filing.. She states that the thumb was cracked and she wears acrylic nails. She reports heavy bleeding after the nail was cracked. Past smoking history history of normal Pap smears and HSV infection. She's had a C-section and laparoscopic surgery in the past. States her tetanus up to date she received a tetanus injection a year ago.Marland Kitchen No pertinent family medical history significant to today's visit and she does not smoke. She is allergic to Ortho-Novum 1/35 birth control pills   The history is provided by the patient.  Laceration  Location:  Finger Finger laceration location:  L thumb Laceration depth: Through the nailbed. Bleeding: venous and controlled   Time since incident:  2 hours Laceration mechanism:  Blunt object and metal edge Pain details:    Quality:  Aching   Severity:  Moderate   Progression:  Unchanged Foreign body present:  No foreign bodies Relieved by:  Nothing Tetanus status:  Up to date Associated symptoms: swelling   Associated symptoms: no fever, no focal weakness, no numbness, no rash, no redness and no streaking     Past Medical History:  Diagnosis Date  . Herpes simplex infection    Inside mouth  . History of abnormal Pap smear 2013   HPV  . No pertinent past medical history     Patient Active Problem List   Diagnosis Date Noted  . Irregular menses 01/31/2014  . Chronic pelvic pain in female 06/30/2012  . Dyspareunia 05/14/2012  . Depression 10/27/2011    Past Surgical History:  Procedure Laterality Date  . CESAREAN SECTION     2009  . LAPAROSCOPY  06/30/2012   Procedure: LAPAROSCOPY OPERATIVE;  Surgeon: Donnamae Jude, MD;  Location: [facility];  Service: Gynecology;  Laterality: N/A;  with peritoneal biopsy  . WISDOM TOOTH EXTRACTION  2013   x1    OB History    Gravida Para Term Preterm AB Living   1 1       1    SAB TAB Ectopic Multiple Live Births                   Home Medications    Prior to Admission medications   Medication Sig Start Date End Date Taking? Authorizing Provider  sertraline (ZOLOFT) 25 MG tablet Take 25 mg by mouth daily.   Yes Historical Provider, MD  mupirocin ointment (BACTROBAN) 2 % Apply 1 application topically 3 (three) times daily. 07/17/16   Frederich Cha, MD    Family History Family History  Problem Relation Age of Onset  . Heart disease Mother   . Heart disease Brother   . Heart disease Maternal Grandfather     Social History Social History  Substance Use Topics  . Smoking status: Never Smoker  . Smokeless tobacco: Never Used  . Alcohol use Yes     Comment: occasionally     Allergies   Ortho-novum 1-35 (28) [norethin-eth estrad triphasic]   Review of Systems Review of Systems  Constitutional: Negative for fever.  Musculoskeletal: Positive for myalgias.  Skin: Negative for rash.  Neurological: Negative for focal weakness.  All other systems reviewed and are negative.    Physical Exam Triage Vital Signs ED Triage Vitals  Enc Vitals Group     BP 07/17/16 1732 131/87     Pulse Rate 07/17/16 1732 80     Resp 07/17/16 1732 16     Temp 07/17/16 1732 98 F (36.7 C)     Temp Source 07/17/16 1732 Oral     SpO2 07/17/16 1732 98 %     Weight 07/17/16 1734 193 lb (87.5 kg)     Height 07/17/16 1734 5\' 3"  (1.6 m)     Head Circumference --      Peak Flow --      Pain Score --      Pain Loc --      Pain Edu? --      Excl. in [place]? --    No data found.   Updated Vital Signs BP 131/87 (BP Location: Left Arm)   Pulse 80   Temp 98 F (36.7 C) (Oral)   Resp 16   Ht 5\' 3"  (1.6 m)   Wt 193 lb (87.5 kg)   SpO2 98%   BMI 34.19 kg/m   Visual  Acuity Right Eye Distance:   Left Eye Distance:   Bilateral Distance:    Right Eye Near:   Left Eye Near:    Bilateral Near:     Physical Exam  Constitutional: She is oriented to person, place, and time. She appears well-developed and well-nourished.  HENT:  Head: Normocephalic and atraumatic.  Eyes: Pupils are equal, round, and reactive to light.  Neck: Normal range of motion.  Pulmonary/Chest: Effort normal.  Musculoskeletal:       Left hand: She exhibits tenderness.       Hands: Patient with a partially avulsed nail swelling of the left thumb and tenderness to palpation as well  Neurological: She is alert and oriented to person, place, and time.  Skin: Skin is warm and dry.  Psychiatric: She has a normal mood and affect.  Vitals reviewed.    UC Treatments / Results  Labs (all labs ordered are listed, but only abnormal results are displayed) Labs Reviewed - No data to display  EKG  EKG Interpretation None       Radiology Dg Finger Thumb Left  Result Date: 07/17/2016 CLINICAL DATA:  Closed cabinet on thumb at 1600 hours today, distal thumb pain, contusion EXAM: LEFT THUMB 2+V COMPARISON:  None FINDINGS: Osseous mineralization normal. Joint spaces preserved. No fracture, dislocation, or bone destruction. IMPRESSION: No acute osseous abnormalities. Electronically Signed   By: Lavonia Dana M.D.   On: 07/17/2016 18:44    Procedures .Nail Removal Date/Time: 07/17/2016 6:29 PM Performed by: Frederich Cha Authorized by: Frederich Cha   Consent:    Consent obtained:  Verbal Location:    Hand:  L thumb Pre-procedure details:    Skin preparation:  Betadine Anesthesia (see MAR for exact dosages):    Anesthesia method:  Local infiltration and nerve block   Local anesthetic:  Lidocaine 1% w/o epi Nail Removal:    Nail removed:  Partial   Nail bed repaired: no   Trephination:    Subungual hematoma drained: no   Ingrown nail:    Wedge excision of skin: no     Nail  matrix removed or ablated:  None Post-procedure details:    Dressing:  Non-adhesive packing strip, 4x4 sterile gauze and antibiotic ointment Comments:     Patient nail was removed and she  tolerated procedure quite well. Hemostat was used to remove the distal end of the nail.    (including critical care time)  Medications Ordered in UC Medications - No data to display   Initial Impression / Assessment and Plan / UC Course  I have reviewed the triage vital signs and the nursing notes.  Pertinent labs & imaging results that were available during my care of the patient were reviewed by me and considered in my medical decision making (see chart for details).  Clinical Course  Patient tolerated the partial nail removal. X-ray was obtained of the film to make sure there was no fracture of the thumb instructions to follow-up with here as needed.    Final Clinical Impressions(s) / UC Diagnoses   Final diagnoses:  Laceration of right thumb without foreign body, nail damage status unspecified, initial encounter  Injury of nail bed of thumb, left, initial encounter    New Prescriptions New Prescriptions   MUPIROCIN OINTMENT (BACTROBAN) 2 %    Apply 1 application topically 3 (three) times daily.     No fractures were seen. The thumb was wrapped. Prescription for Bactroban ointment given to patient will restrict the use of the left thumb at work and have a follow-up miss Charna Archer in 1-2 weeks for full clearance   Frederich Cha, MD 07/17/16 1859    Frederich Cha, MD 07/17/16 1900

## 2016-07-17 NOTE — ED Triage Notes (Signed)
At work, Environmental manager on left thumb. Has partial thumb nail avulsion.

## 2016-09-11 ENCOUNTER — Other Ambulatory Visit: Payer: Self-pay | Admitting: Obstetrics and Gynecology

## 2016-09-11 DIAGNOSIS — M7989 Other specified soft tissue disorders: Secondary | ICD-10-CM

## 2016-09-11 DIAGNOSIS — R2231 Localized swelling, mass and lump, right upper limb: Principal | ICD-10-CM

## 2016-09-15 ENCOUNTER — Ambulatory Visit
Admission: RE | Admit: 2016-09-15 | Discharge: 2016-09-15 | Disposition: A | Discharge status: Home / Self Care | Payer: BLUE CROSS/BLUE SHIELD | Source: Ambulatory Visit | Attending: Obstetrics and Gynecology | Admitting: Obstetrics and Gynecology

## 2017-08-26 ENCOUNTER — Inpatient Hospital Stay: Admission: RE | Admit: 2017-08-26 | Payer: Self-pay | Source: Ambulatory Visit

## 2017-08-27 ENCOUNTER — Encounter
Admission: RE | Admit: 2017-08-27 | Discharge: 2017-08-27 | Disposition: A | Discharge status: Home / Self Care | Payer: BLUE CROSS/BLUE SHIELD | Source: Ambulatory Visit | Attending: Surgery | Admitting: Surgery

## 2017-08-27 ENCOUNTER — Other Ambulatory Visit: Payer: Self-pay

## 2017-08-27 HISTORY — DX: Gastro-esophageal reflux disease without esophagitis: K21.9

## 2017-08-27 NOTE — Patient Instructions (Signed)
  Your procedure is scheduled on: 09-01-17 TUESDAY Report to Same Day Surgery 2nd floor medical mall [facility] Entrance-take elevator on left to 2nd floor.  Check in with surgery information desk.) To find out your arrival time please call [phone] between 1PM - 3PM on 08-31-17 MONDAY  Remember: Instructions that are not followed completely may result in serious medical risk, up to and including death, or upon the discretion of your surgeon and anesthesiologist your surgery may need to be rescheduled.    _x___ 1. Do not eat food after midnight the night before your procedure. NO GUM CHEWING OR CANDY AFTER MIDNIGHT.  You may drink clear liquids up to 2 hours before you are scheduled to arrive at the hospital for your procedure.  Do not drink clear liquids within 2 hours of your scheduled arrival to the hospital.  Clear liquids include  --Water or Apple juice without pulp  --Clear carbohydrate beverage such as ClearFast or Gatorade  --Black Coffee or Clear Tea (No milk, no creamers, do not add anything to the coffee or Tea     __x__ 2. No Alcohol for 24 hours before or after surgery.   __x__3. No Smoking/VAPING for 24 prior to surgery.   ____  4. Bring all medications with you on the day of surgery if instructed.    __x__ 5. Notify your doctor if there is any change in your medical condition     (cold, fever, infections).     Do not wear jewelry, make-up, hairpins, clips or nail polish.  Do not wear lotions, powders, or perfumes. You may wear deodorant.  Do not shave 48 hours prior to surgery. Men may shave face and neck.  Do not bring valuables to the hospital.    [facility] is not responsible for any belongings or valuables.               Contacts, dentures or bridgework may not be worn into surgery.  Leave your suitcase in the car. After surgery it may be brought to your room.  For patients admitted to the hospital, discharge time is determined by your  treatment  team.   Patients discharged the day of surgery will not be allowed to drive home.  You will need someone to drive you home and stay with you the night of your procedure.    Please read over the following fact sheets that you were given:   [facility] Preparing for Surgery and or MRSA Information   _x___ TAKE THE FOLLOWING MEDICATION THE MORNING OF SURGERY WITH A SMALL SIP OF WATER. These include:  1. PEPCID  2. TAKE A PEPCID Monday NIGHT BEFORE BED  3.  4.  5.  6.  ____Fleets enema or Magnesium Citrate as directed.   _x___ Use CHG Soap or sage wipes as directed on instruction sheet   ____ Use inhalers on the day of surgery and bring to hospital day of surgery  ____ Stop Metformin and Janumet 2 days prior to surgery.    ____ Take 1/2 of usual insulin dose the night before surgery and none on the morning surgery.   ____ Follow recommendations from Cardiologist, Pulmonologist or PCP regarding stopping Aspirin, Coumadin, Plavix ,Eliquis, Effient, or Pradaxa, and Pletal.  X____Stop Anti-inflammatories such as Advil, Aleve, Ibuprofen, Motrin, Naproxen, Naprosyn, Goodies powders or aspirin products NOW-OK to take Tylenol    ____ Stop supplements until after surgery.    ____ Bring C-Pap to the hospital.

## 2017-08-31 ENCOUNTER — Encounter
Admission: RE | Admit: 2017-08-31 | Discharge: 2017-08-31 | Disposition: A | Discharge status: Home / Self Care | Payer: BLUE CROSS/BLUE SHIELD | Source: Ambulatory Visit | Attending: Surgery | Admitting: Surgery

## 2017-08-31 DIAGNOSIS — Z79899 Other long term (current) drug therapy: Secondary | ICD-10-CM | POA: Diagnosis not present

## 2017-08-31 DIAGNOSIS — Z6834 Body mass index (BMI) 34.0-34.9, adult: Secondary | ICD-10-CM | POA: Diagnosis not present

## 2017-08-31 DIAGNOSIS — Z888 Allergy status to other drugs, medicaments and biological substances status: Secondary | ICD-10-CM | POA: Diagnosis not present

## 2017-08-31 DIAGNOSIS — D1721 Benign lipomatous neoplasm of skin and subcutaneous tissue of right arm: Secondary | ICD-10-CM | POA: Diagnosis not present

## 2017-08-31 DIAGNOSIS — K219 Gastro-esophageal reflux disease without esophagitis: Secondary | ICD-10-CM | POA: Diagnosis not present

## 2017-08-31 LAB — HCG, QUANTITATIVE, PREGNANCY

## 2017-09-01 ENCOUNTER — Ambulatory Visit: Payer: BLUE CROSS/BLUE SHIELD | Admitting: Certified Registered Nurse Anesthetist

## 2017-09-01 ENCOUNTER — Encounter: Payer: Self-pay | Admitting: Certified Registered Nurse Anesthetist

## 2017-09-01 ENCOUNTER — Encounter
Admission: RE | Disposition: A | Discharge status: Home / Self Care | Payer: Self-pay | Source: Ambulatory Visit | Attending: Surgery

## 2017-09-01 ENCOUNTER — Ambulatory Visit
Admission: RE | Admit: 2017-09-01 | Discharge: 2017-09-01 | Disposition: A | Discharge status: Home / Self Care | Payer: BLUE CROSS/BLUE SHIELD | Source: Ambulatory Visit | Attending: Surgery | Admitting: Surgery

## 2017-09-01 DIAGNOSIS — D1721 Benign lipomatous neoplasm of skin and subcutaneous tissue of right arm: Secondary | ICD-10-CM | POA: Insufficient documentation

## 2017-09-01 DIAGNOSIS — Z79899 Other long term (current) drug therapy: Secondary | ICD-10-CM | POA: Insufficient documentation

## 2017-09-01 DIAGNOSIS — Z888 Allergy status to other drugs, medicaments and biological substances status: Secondary | ICD-10-CM | POA: Insufficient documentation

## 2017-09-01 DIAGNOSIS — K219 Gastro-esophageal reflux disease without esophagitis: Secondary | ICD-10-CM | POA: Insufficient documentation

## 2017-09-01 DIAGNOSIS — Z6834 Body mass index (BMI) 34.0-34.9, adult: Secondary | ICD-10-CM | POA: Insufficient documentation

## 2017-09-01 HISTORY — PX: EXCISION MASS UPPER EXTREMETIES: SHX6704

## 2017-09-01 SURGERY — EXCISION MASS UPPER EXTREMITIES
Anesthesia: General | Site: Axilla | Laterality: Right | Wound class: Clean

## 2017-09-01 MED ORDER — LIDOCAINE HCL (CARDIAC) 20 MG/ML IV SOLN
INTRAVENOUS | Status: DC | PRN
  Administered 2017-09-01: 60 mg via INTRAVENOUS

## 2017-09-01 MED ORDER — ONDANSETRON HCL 4 MG/2ML IJ SOLN
INTRAMUSCULAR | Status: AC
  Filled 2017-09-01: qty 2

## 2017-09-01 MED ORDER — SUGAMMADEX SODIUM 200 MG/2ML IV SOLN
INTRAVENOUS | Status: DC | PRN
  Administered 2017-09-01: 200 mg via INTRAVENOUS

## 2017-09-01 MED ORDER — LACTATED RINGERS IV SOLN
INTRAVENOUS | Status: DC
  Administered 2017-09-01: 09:00:00 via INTRAVENOUS

## 2017-09-01 MED ORDER — ONDANSETRON HCL 4 MG/2ML IJ SOLN
INTRAMUSCULAR | Status: DC | PRN
  Administered 2017-09-01: 4 mg via INTRAVENOUS

## 2017-09-01 MED ORDER — BUPIVACAINE-EPINEPHRINE 0.5% -1:200000 IJ SOLN
INTRAMUSCULAR | Status: DC | PRN
  Administered 2017-09-01: 3 mL

## 2017-09-01 MED ORDER — FENTANYL CITRATE (PF) 100 MCG/2ML IJ SOLN
INTRAMUSCULAR | Status: DC | PRN
  Administered 2017-09-01 (×3): 50 ug via INTRAVENOUS

## 2017-09-01 MED ORDER — LIDOCAINE HCL (PF) 4 % IJ SOLN
INTRAMUSCULAR | Status: DC | PRN
  Administered 2017-09-01: 160 mg

## 2017-09-01 MED ORDER — ROCURONIUM BROMIDE 100 MG/10ML IV SOLN
INTRAVENOUS | Status: DC | PRN
  Administered 2017-09-01: 35 mg via INTRAVENOUS

## 2017-09-01 MED ORDER — SUGAMMADEX SODIUM 200 MG/2ML IV SOLN
INTRAVENOUS | Status: AC
  Filled 2017-09-01: qty 2

## 2017-09-01 MED ORDER — ONDANSETRON HCL 4 MG/2ML IJ SOLN: 4.0000 mg | Freq: Once | INTRAMUSCULAR | Status: DC | PRN

## 2017-09-01 MED ORDER — DEXAMETHASONE SODIUM PHOSPHATE 10 MG/ML IJ SOLN
INTRAMUSCULAR | Status: AC
  Filled 2017-09-01: qty 1

## 2017-09-01 MED ORDER — MIDAZOLAM HCL 2 MG/2ML IJ SOLN
INTRAMUSCULAR | Status: DC | PRN
  Administered 2017-09-01: 2 mg via INTRAVENOUS

## 2017-09-01 MED ORDER — BUPIVACAINE-EPINEPHRINE (PF) 0.5% -1:200000 IJ SOLN
INTRAMUSCULAR | Status: AC
  Filled 2017-09-01: qty 30

## 2017-09-01 MED ORDER — PROPOFOL 10 MG/ML IV BOLUS
INTRAVENOUS | Status: DC | PRN
  Administered 2017-09-01: 180 mg via INTRAVENOUS

## 2017-09-01 MED ORDER — FENTANYL CITRATE (PF) 100 MCG/2ML IJ SOLN
INTRAMUSCULAR | Status: AC
  Filled 2017-09-01: qty 2

## 2017-09-01 MED ORDER — HYDROCODONE-ACETAMINOPHEN 5-325 MG PO TABS: 1.0000 | ORAL_TABLET | ORAL | 0 refills | Status: DC | PRN

## 2017-09-01 MED ORDER — PROPOFOL 10 MG/ML IV BOLUS
INTRAVENOUS | Status: AC
  Filled 2017-09-01: qty 20

## 2017-09-01 MED ORDER — MIDAZOLAM HCL 2 MG/2ML IJ SOLN
INTRAMUSCULAR | Status: AC
  Filled 2017-09-01: qty 2

## 2017-09-01 MED ORDER — DEXAMETHASONE SODIUM PHOSPHATE 10 MG/ML IJ SOLN
INTRAMUSCULAR | Status: DC | PRN
  Administered 2017-09-01: 8 mg via INTRAVENOUS

## 2017-09-01 MED ORDER — LIDOCAINE HCL (PF) 2 % IJ SOLN
INTRAMUSCULAR | Status: AC
  Filled 2017-09-01: qty 10

## 2017-09-01 MED ORDER — FENTANYL CITRATE (PF) 100 MCG/2ML IJ SOLN: 25.0000 ug | INTRAMUSCULAR | Status: DC | PRN

## 2017-09-01 SURGICAL SUPPLY — 26 items
BLADE SURG 15 STRL LF DISP TIS (BLADE) ×1 IMPLANT
BLADE SURG 15 STRL SS (BLADE) ×2
CHLORAPREP W/TINT 26ML (MISCELLANEOUS) ×3 IMPLANT
DERMABOND ADVANCED (GAUZE/BANDAGES/DRESSINGS) ×2
DERMABOND ADVANCED .7 DNX12 (GAUZE/BANDAGES/DRESSINGS) ×1 IMPLANT
DRAPE LAPAROTOMY 100X77 ABD (DRAPES) ×3 IMPLANT
ELECT REM PT RETURN 9FT ADLT (ELECTROSURGICAL) ×3
ELECTRODE REM PT RTRN 9FT ADLT (ELECTROSURGICAL) ×1 IMPLANT
GLOVE BIO SURGEON STRL SZ7.5 (GLOVE) ×3 IMPLANT
GOWN STRL REUS W/ TWL LRG LVL3 (GOWN DISPOSABLE) ×2 IMPLANT
GOWN STRL REUS W/TWL LRG LVL3 (GOWN DISPOSABLE) ×4
KIT RM TURNOVER STRD PROC AR (KITS) ×3 IMPLANT
LABEL OR SOLS (LABEL) ×3 IMPLANT
NEEDLE HYPO 25X1 1.5 SAFETY (NEEDLE) ×6 IMPLANT
NS IRRIG 500ML POUR BTL (IV SOLUTION) ×3 IMPLANT
PACK BASIN MINOR ARMC (MISCELLANEOUS) ×3 IMPLANT
SUT MNCRL 3-0 UNDYED SH (SUTURE) ×1 IMPLANT
SUT MNCRL 4-0 (SUTURE) ×2
SUT MNCRL 4-0 27XMFL (SUTURE) ×1
SUT MONOCRYL 3-0 UNDYED (SUTURE) ×2
SUT VIC AB 4-0 SH 27 (SUTURE) ×2
SUT VIC AB 4-0 SH 27XANBCTRL (SUTURE) ×1 IMPLANT
SUTURE MNCRL 4-0 27XMF (SUTURE) ×1 IMPLANT
SYR 10ML LL (SYRINGE) ×3 IMPLANT
SYR BULB EAR ULCER 3OZ GRN STR (SYRINGE) ×3 IMPLANT
SYRINGE 10CC LL (SYRINGE) ×3 IMPLANT

## 2017-09-01 NOTE — Discharge Instructions (Addendum)
Take Tylenol or Norco if needed for pain.  Should not drive or do anything dangerous when taking Norco.  May shower and blot dry.  Resume usual activities as tolerated.    AMBULATORY SURGERY  DISCHARGE INSTRUCTIONS   1) The drugs that you were given will stay in your system until tomorrow so for the next 24 hours you should not:  A) Drive an automobile B) Make any legal decisions C) Drink any alcoholic beverage   2) You may resume regular meals tomorrow.  Today it is better to start with liquids and gradually work up to solid foods.  You may eat anything you prefer, but it is better to start with liquids, then soup and crackers, and gradually work up to solid foods.   3) Please notify your doctor immediately if you have any unusual bleeding, trouble breathing, redness and pain at the surgery site, drainage, fever, or pain not relieved by medication.    4) Additional Instructions:        Please contact your physician with any problems or Same Day Surgery at [phone], Monday through Friday 6 am to 4 pm, or [place] at [facility] number at [phone].

## 2017-09-01 NOTE — H&P (Signed)
  She comes in today prepared for excision of a mass in the upper aspect of the right arm.  She reports no change in overall condition since the office exam.  The mass was examined.  The right arm was marked YES.  I discussed the plan for surgery

## 2017-09-01 NOTE — Anesthesia Post-op Follow-up Note (Signed)
Anesthesia QCDR form completed.        

## 2017-09-01 NOTE — Op Note (Signed)
OPERATIVE REPORT  PREOPERATIVE  DIAGNOSIS: .  Lipoma of right upper arm  POSTOPERATIVE DIAGNOSIS: .  Lipoma of right upper arm  PROCEDURE: .  Excision lipoma of right upper arm  ANESTHESIA:  General  SURGEON: Rochel Brome  MD   INDICATIONS: .  She recently presented to the office with a mass of the right upper arm near the axilla which has been slowly increasing in size.  Excision was recommended for definitive treatment.  With the patient on the operating table in the supine position.  She was placed under general anesthesia.  She was then rolled into the left lateral decubitus position and a pad was placed under the rib cage.  Bean bag cushion was activated.  The right forearm was placed on an arm support.  The right upper arm and axilla were prepared with ChloraPrep and draped in a sterile manner.  The lipoma was palpated.  A slightly obliquely oriented transverse incision was made removing a narrow ellipse of overlying skin.  Dissection was carried down through a thin layer of subcutaneous tissue to encounter a lipoma.  The lipoma was dissected free from surrounding structures with blunt and sharp dissection.  There was moderate degree of lobulation.  The lipoma was excised in one portion of tissue.  The ex vivo measurement was 8 cm.  This was submitted in formalin for routine pathology.  The wound was inspected and several tiny bleeding points were cauterized.  The subcutaneous tissues were infiltrated with 1/2% Sensorcaine with epinephrine.  The wound was closed with a running 4-0 Monocryl subcuticular suture and Dermabond  The patient tolerated the procedure satisfactorily.  She was rolled over onto a stretcher and prepared for transfer to the recovery room  [name] MD

## 2017-09-01 NOTE — Anesthesia Preprocedure Evaluation (Signed)
Anesthesia Evaluation  Patient identified by MRN, date of birth, ID band Patient awake    Reviewed: Allergy & Precautions, H&P , NPO status , Patient's Chart, lab work & pertinent test results, reviewed documented beta blocker date and time   Airway Mallampati: II  TM Distance: >3 FB Neck ROM: full    Dental  (+) Teeth Intact   Pulmonary neg pulmonary ROS,    Pulmonary exam normal        Cardiovascular Exercise Tolerance: Good negative cardio ROS Normal cardiovascular exam Rhythm:regular Rate:Normal     Neuro/Psych PSYCHIATRIC DISORDERS negative neurological ROS  negative psych ROS   GI/Hepatic negative GI ROS, Neg liver ROS, GERD  Medicated,  Endo/Other  negative endocrine ROSMorbid obesity  Renal/GU negative Renal ROS  negative genitourinary   Musculoskeletal   Abdominal   Peds  Hematology negative hematology ROS (+)   Anesthesia Other Findings Past Medical History: No date: GERD (gastroesophageal reflux disease) No date: Herpes simplex infection     Comment:  Inside mouth 2013: History of abnormal Pap smear     Comment:  HPV No date: No pertinent past medical history Past Surgical History: No date: CESAREAN SECTION     Comment:  2009 06/30/2012: LAPAROSCOPY OPERATIVE; N/A     Comment:  Performed by Donnamae Jude, MD at [facility] 2013: WISDOM TOOTH EXTRACTION     Comment:  x1 BMI    Body Mass Index:  34.54 kg/m     Reproductive/Obstetrics negative OB ROS                             Anesthesia Physical Anesthesia Plan  ASA: II  Anesthesia Plan: General ETT   Post-op Pain Management:    Induction:   PONV Risk Score and Plan:   Airway Management Planned:   Additional Equipment:   Intra-op Plan:   Post-operative Plan:   Informed Consent: I have reviewed the patients History and Physical, chart, labs and discussed the procedure including the risks, benefits and  alternatives for the proposed anesthesia with the patient or authorized representative who has indicated his/her understanding and acceptance.   Dental Advisory Given  Plan Discussed with: CRNA  Anesthesia Plan Comments:         Anesthesia Quick Evaluation

## 2017-09-01 NOTE — Transfer of Care (Signed)
Immediate Anesthesia Transfer of Care Note  Patient: Charlene Garcia  Procedure(s) Performed: EXCISION AXILLARY MASS (Right Axilla)  Patient Location: PACU  Anesthesia Type:General  Level of Consciousness: drowsy  Airway & Oxygen Therapy: Patient Spontanous Breathing and Patient connected to nasal cannula oxygen  Post-op Assessment: Report given to RN and Post -op Vital signs reviewed and stable  Post vital signs: Reviewed and stable  Last Vitals:  Vitals:   09/01/17 0829  BP: 119/83  Pulse: 80  Resp: 16  Temp: 36.8 C  SpO2: 100%    Last Pain:  Vitals:   09/01/17 0829  TempSrc: Oral         Complications: No apparent anesthesia complications

## 2017-09-01 NOTE — Anesthesia Procedure Notes (Signed)
Procedure Name: Intubation Date/Time: 09/01/2017 9:08 AM Performed by: Eben Burow, CRNA Pre-anesthesia Checklist: Patient identified, Emergency Drugs available, Suction available, Patient being monitored and Timeout performed Patient Re-evaluated:Patient Re-evaluated prior to induction Oxygen Delivery Method: Circle system utilized Preoxygenation: Pre-oxygenation with 100% oxygen Induction Type: IV induction Ventilation: Mask ventilation without difficulty Laryngoscope Size: Miller and 2 Grade View: Grade I Tube type: Oral Tube size: 7.0 mm Number of attempts: 1 Airway Equipment and Method: Stylet Placement Confirmation: ETT inserted through vocal cords under direct vision,  positive ETCO2 and breath sounds checked- equal and bilateral Secured at: 22 cm Tube secured with: Tape Dental Injury: Teeth and Oropharynx as per pre-operative assessment

## 2017-09-02 LAB — SURGICAL PATHOLOGY

## 2017-09-02 NOTE — Anesthesia Postprocedure Evaluation (Signed)
Anesthesia Post Note  Patient: Charlene Garcia  Procedure(s) Performed: EXCISION AXILLARY MASS (Right Axilla)  Patient location during evaluation: PACU Anesthesia Type: General Level of consciousness: awake and alert Pain management: pain level controlled Vital Signs Assessment: post-procedure vital signs reviewed and stable Respiratory status: spontaneous breathing, nonlabored ventilation, respiratory function stable and patient connected to nasal cannula oxygen Cardiovascular status: blood pressure returned to baseline and stable Postop Assessment: no apparent nausea or vomiting Anesthetic complications: no     Last Vitals:  Vitals:   09/01/17 1140 09/01/17 1151  BP: 114/75 108/79  Pulse: 77 79  Resp: (!) 22 18  Temp: 36.9 C   SpO2: 96% 97%    Last Pain:  Vitals:   09/02/17 0815  TempSrc:   PainSc: 0-No pain                 Molli Barrows

## 2019-03-19 LAB — OB RESULTS CONSOLE HEPATITIS B SURFACE ANTIGEN: Hepatitis B Surface Ag: NEGATIVE

## 2019-03-19 LAB — OB RESULTS CONSOLE HIV ANTIBODY (ROUTINE TESTING): HIV: NONREACTIVE

## 2019-03-19 LAB — OB RESULTS CONSOLE RUBELLA ANTIBODY, IGM: Rubella: IMMUNE

## 2019-03-19 LAB — OB RESULTS CONSOLE VARICELLA ZOSTER ANTIBODY, IGG: Varicella: IMMUNE

## 2019-04-12 ENCOUNTER — Ambulatory Visit: Payer: BLUE CROSS/BLUE SHIELD | Admitting: *Deleted

## 2019-04-13 ENCOUNTER — Other Ambulatory Visit: Payer: Self-pay

## 2019-04-13 ENCOUNTER — Encounter: Payer: BC Managed Care – PPO | Attending: Certified Nurse Midwife | Admitting: *Deleted

## 2019-04-13 ENCOUNTER — Encounter: Payer: Self-pay | Admitting: *Deleted

## 2019-04-13 VITALS — BP 102/68 | Ht 63.0 in | Wt 203.2 lb

## 2019-04-13 DIAGNOSIS — Z3A00 Weeks of gestation of pregnancy not specified: Secondary | ICD-10-CM | POA: Diagnosis not present

## 2019-04-13 DIAGNOSIS — O24410 Gestational diabetes mellitus in pregnancy, diet controlled: Secondary | ICD-10-CM | POA: Insufficient documentation

## 2019-04-13 NOTE — Progress Notes (Signed)
Diabetes Self-Management Education  Visit Type: First/Initial  Appt. Start Time: 0905 Appt. End Time: 6962  04/13/2019  [name], identified by name and date of birth, is a 33 y.o. female with a diagnosis of Diabetes: Gestational Diabetes.   ASSESSMENT  Blood pressure 102/68, height 5\' 3"  (1.6 m), weight 203 lb 3.2 oz (92.2 kg), last menstrual period 01/01/2019, estimated date of delivery 10/13/2019 Body mass index is 36 kg/m.  Diabetes Self-Management Education - 04/13/19 1013      Visit Information   Visit Type  First/Initial      Initial Visit   Diabetes Type  Gestational Diabetes    Are you currently following a meal plan?  Yes    What type of meal plan do you follow?  "started to limit carbs and sugars, add more fruit and vegetabkes"    Are you taking your medications as prescribed?  Yes    Date Diagnosed  1 weeks ago      Health Coping   How would you rate your overall health?  Good      Psychosocial Assessment   Patient Belief/Attitude about Diabetes  Other (comment)   "worried"   Self-care barriers  None    Self-management support  Doctor's office;Family    Patient Concerns  Nutrition/Meal planning;Glycemic Control;Monitoring;Weight Control;Healthy Lifestyle    Special Needs  None    Preferred Learning Style  Auditory;Visual;Hands on    [place] in progress    How often do you need to have someone help you when you read instructions, pamphlets, or other written materials from your doctor or pharmacy?  1 - Never    What is the last grade level you completed in school?  college      Pre-Education Assessment   Patient understands the diabetes disease and treatment process.  Needs Instruction    Patient understands incorporating nutritional management into lifestyle.  Needs Instruction    Patient undertands incorporating physical activity into lifestyle.  Needs Instruction    Patient understands using medications safely.  Needs Instruction     Patient understands monitoring blood glucose, interpreting and using results  Needs Instruction    Patient understands prevention, detection, and treatment of acute complications.  Needs Instruction    Patient understands prevention, detection, and treatment of chronic complications.  Needs Instruction    Patient understands how to develop strategies to address psychosocial issues.  Needs Instruction    Patient understands how to develop strategies to promote health/change behavior.  Needs Instruction      Complications   Last HgB A1C per patient/outside source  5.4 %   03/21/19   How often do you check your blood sugar?  0 times/day (not testing)   Provided Accu-Chek Guide Me meter and instructed on use. BG upon return demonstration was 104 mg/dL at 10:35 am - 2 hrs pp.   Have you had a dilated eye exam in the past 12 months?  Yes    Have you had a dental exam in the past 12 months?  Yes    Are you checking your feet?  No      Dietary Intake   Breakfast  poptart, English muffin or biscuit    Snack (morning)  3 snacks/day - nuts, chips, crackers, cheese    Lunch  hibachi wtih steak and chicken, sub, pasta, chicken, [employer]    Dinner  chicken and beef, potatoes, bread, rice, pasta, green beans, peas, corn, broccoli, salads    Beverage(s)  water, soda, juice      Exercise   Exercise Type  ADL's      Patient Education   Previous Diabetes Education  No    Disease state   Definition of diabetes, type 1 and 2, and the diagnosis of diabetes;Factors that contribute to the development of diabetes    Nutrition management   Role of diet in the treatment of diabetes and the relationship between the three main macronutrients and blood glucose level;Reviewed blood glucose goals for pre and post meals and how to evaluate the patients' food intake on their blood glucose level.    Physical activity and exercise   Role of exercise on diabetes management, blood pressure control and cardiac health.     Monitoring  Taught/evaluated SMBG meter.;Purpose and frequency of SMBG.;Taught/discussed recording of test results and interpretation of SMBG.;Ketone testing, when, how.    Chronic complications  Relationship between chronic complications and blood glucose control    Psychosocial adjustment  Identified and addressed patients feelings and concerns about diabetes    Preconception care  Pregnancy and GDM  Role of pre-pregnancy blood glucose control on the development of the fetus;Reviewed with patient blood glucose goals with pregnancy;Role of family planning for patients with diabetes      Individualized Goals (developed by patient)   Reducing Risk  Improve blood sugars Prevent diabetes complications Lose weight Lead a healthier lifestyle     Outcomes   Expected Outcomes  Demonstrated interest in learning. Expect positive outcomes       Individualized Plan for Diabetes Self-Management Training:   Learning Objective:  Patient will have a greater understanding of diabetes self-management. Patient education plan is to attend individual and/or group sessions per assessed needs and concerns.   Plan:   Patient Instructions  Read booklet on Gestational Diabetes Follow Gestational Meal Planning Guidelines Avoid sugar sweetened drinks (juice, soda) Limit desserts/sweets Allow 2-3 hours between meals and snacks Complete a 3 Day Food Record and bring to next appointment Check blood sugars 4 x day - before breakfast and 2 hrs after every meal and record  Bring blood sugar log to all appointments Call MD for prescription for meter strips and lancets Strips   Accu-Chek Guide Lancets   Accu-Chek FastClix Purchase urine ketone strips if ordered by MD and check urine ketones every am:  If + increase bedtime snack to 1 protein and 2 carbohydrate servings Walk 20-30 minutes at least 5 x week if permitted by MD  Expected Outcomes:  Demonstrated interest in learning. Expect positive  outcomes  Education material provided:  Gestational Booklet Gestational Meal Planning Guidelines Simple Meal Plan Viewed Gestational Diabetes Video Meter - Accu-Chek Guide Me 3 Day Food Record Goals for a Healthy Pregnancy  If problems or questions, patient to contact team via:  Johny Drilling, [place], [place], CDE [phone]  Future DSME appointment:  May 03, 2019 with the dietitian

## 2019-04-13 NOTE — Patient Instructions (Signed)
Read booklet on Gestational Diabetes Follow Gestational Meal Planning Guidelines Avoid sugar sweetened drinks (juice, soda) Limit desserts/sweets Allow 2-3 hours between meals and snacks Complete a 3 Day Food Record and bring to next appointment Check blood sugars 4 x day - before breakfast and 2 hrs after every meal and record  Bring blood sugar log to all appointments Call MD for prescription for meter strips and lancets Strips   Accu-Chek Guide Lancets   Accu-Chek FastClix Purchase urine ketone strips if ordered by MD and check urine ketones every am:  If + increase bedtime snack to 1 protein and 2 carbohydrate servings Walk 20-30 minutes at least 5 x week if permitted by MD

## 2019-05-03 ENCOUNTER — Ambulatory Visit: Payer: BC Managed Care – PPO | Admitting: Dietician

## 2019-05-05 ENCOUNTER — Telehealth: Payer: Self-pay | Admitting: Dietician

## 2019-05-05 NOTE — Telephone Encounter (Signed)
Called Charlene Garcia and left message requesting that she reschedule her missed appointment 05/03/19. Gave tentative date of 05/17/19 at 9:00am and ask that she call to confirm.

## 2019-05-17 ENCOUNTER — Encounter: Payer: Self-pay | Admitting: Dietician

## 2019-09-05 ENCOUNTER — Other Ambulatory Visit: Payer: Self-pay | Admitting: Obstetrics and Gynecology

## 2019-09-05 ENCOUNTER — Observation Stay
Admission: EM | Admit: 2019-09-05 | Discharge: 2019-09-05 | Disposition: A | Discharge status: Home / Self Care | Payer: Medicaid Other | Attending: Obstetrics and Gynecology | Admitting: Obstetrics and Gynecology

## 2019-09-05 DIAGNOSIS — B009 Herpesviral infection, unspecified: Secondary | ICD-10-CM | POA: Insufficient documentation

## 2019-09-05 DIAGNOSIS — O98513 Other viral diseases complicating pregnancy, third trimester: Secondary | ICD-10-CM | POA: Insufficient documentation

## 2019-09-05 DIAGNOSIS — O26893 Other specified pregnancy related conditions, third trimester: Principal | ICD-10-CM | POA: Insufficient documentation

## 2019-09-05 DIAGNOSIS — Z3A34 34 weeks gestation of pregnancy: Secondary | ICD-10-CM | POA: Diagnosis not present

## 2019-09-05 DIAGNOSIS — R030 Elevated blood-pressure reading, without diagnosis of hypertension: Secondary | ICD-10-CM | POA: Diagnosis not present

## 2019-09-05 LAB — CBC
HCT: 34.6 % — ABNORMAL LOW (ref 36.0–46.0)
Hemoglobin: 11.9 g/dL — ABNORMAL LOW (ref 12.0–15.0)
MCH: 28.8 pg (ref 26.0–34.0)
MCHC: 34.4 g/dL (ref 30.0–36.0)
MCV: 83.8 fL (ref 80.0–100.0)
Platelets: 116 10*3/uL — ABNORMAL LOW (ref 150–400)
RBC: 4.13 MIL/uL (ref 3.87–5.11)
RDW: 14.1 % (ref 11.5–15.5)
WBC: 7.7 10*3/uL (ref 4.0–10.5)
nRBC: 0 % (ref 0.0–0.2)

## 2019-09-05 LAB — COMPREHENSIVE METABOLIC PANEL
ALT: 11 U/L (ref 0–44)
AST: 17 U/L (ref 15–41)
Albumin: 3.2 g/dL — ABNORMAL LOW (ref 3.5–5.0)
Alkaline Phosphatase: 71 U/L (ref 38–126)
Anion gap: 11 (ref 5–15)
BUN: 11 mg/dL (ref 6–20)
CO2: 19 mmol/L — ABNORMAL LOW (ref 22–32)
Calcium: 9.1 mg/dL (ref 8.9–10.3)
Chloride: 105 mmol/L (ref 98–111)
Creatinine, Ser: 0.65 mg/dL (ref 0.44–1.00)
GFR calc Af Amer: >60 mL/min (ref >60)
GFR calc non Af Amer: >60 mL/min (ref >60)
Glucose, Bld: 84 mg/dL (ref 70–99)
Potassium: 3.8 mmol/L (ref 3.5–5.1)
Sodium: 135 mmol/L (ref 135–145)
Total Bilirubin: 0.7 mg/dL (ref 0.3–1.2)
Total Protein: 6.4 g/dL — ABNORMAL LOW (ref 6.5–8.1)

## 2019-09-05 LAB — PROTEIN / CREATININE RATIO, URINE
Creatinine, Urine: 175 mg/dL
Protein Creatinine Ratio: 0.07 mg/mg{Cre} (ref 0.00–0.15)
Total Protein, Urine: 13 mg/dL

## 2019-09-05 NOTE — Progress Notes (Signed)
Charlene Garcia is a 33 y.o. female. She is at [redacted]w[redacted]d gestation. Patient's last menstrual period was 01/01/2019 (approximate). Estimated Date of Delivery: 10/13/19  Prenatal care site: [place] from clinic for elevated BP . 140/89/90's . No symptoms  Location: Onset/timing: Duration: Quality:  Severity: Aggravating or alleviating conditions: Associated signs/symptoms: Context:  S: Resting comfortably. no CTX, no VB.no LOF,  Active fetal movement.   Maternal Medical History:   Past Medical History:  Diagnosis Date  . GERD (gastroesophageal reflux disease)   . Gestational diabetes   . Herpes simplex infection    Inside mouth  . History of abnormal Pap smear 2013   HPV  . No pertinent past medical history     Past Surgical History:  Procedure Laterality Date  . CESAREAN SECTION     2009  . EXCISION MASS UPPER EXTREMETIES Right 09/01/2017   Procedure: EXCISION AXILLARY MASS;  Surgeon: Leonie Green, MD;  Location: [facility];  Service: General;  Laterality: Right;  . LAPAROSCOPY  06/30/2012   Procedure: LAPAROSCOPY OPERATIVE;  Surgeon: Donnamae Jude, MD;  Location: [facility];  Service: Gynecology;  Laterality: N/A;  with peritoneal biopsy  . WISDOM TOOTH EXTRACTION  2013   x1    Allergies  Allergen Reactions  . Ortho-Novum 1-35 (28) [Norethin-Eth Estrad Triphasic] Swelling    Prior to Admission medications   Medication Sig Start Date End Date Taking? Authorizing Provider  Prenatal 28-0.8 MG TABS Take 1 tablet by mouth daily.    Yes [provider]  valACYclovir (VALTREX) 500 MG tablet Take 500 mg by mouth as needed.  04/13/19  Yes [provider]  acetaminophen (TYLENOL) 500 MG tablet Take 1,000 mg every 6 (six) hours as needed by mouth for moderate pain or headache.    [provider]  EPINEPHrine 0.3 mg/0.3 mL IJ SOAJ injection INJECT INTO THIGH MUSCLE AS NEEDED FOR ANAPHYLAXIS. 10/16/16   [provider]   VELTIN gel Apply 1 application topically daily as needed.  11/03/18   [provider]     Social History: She  reports that she has never smoked. She has never used smokeless tobacco. She reports that she does not drink alcohol or use drugs.  Family History: family history includes Heart disease in her brother, maternal grandfather, and mother.  no history of gyn cancers  Review of Systems: A full review of systems was performed and negative except as noted in the HPI.   Review of Systems: A full review of systems was performed and negative except as noted in the HPI.   Eyes: no vision change  Ears: left ear pain  Oropharynx: no sore throat  Pulmonary . No shortness of breath , no hemoptysis Cardiovascular: no chest pain , no irregular heart beat  Gastrointestinal:no blood in stool . No diarrhea, no constipation Uro gynecologic: no dysuria , no pelvic pain Neurologic : no seizure , no migraines    Musculoskeletal: no muscular weakness  O:  BP 130/87   Pulse 81   Resp 17   LMP 01/01/2019 (Approximate)  Results for orders placed or performed during the hospital encounter of 09/05/19 (from the past 48 hour(s))  Protein / creatinine ratio, urine   Collection Time: 09/05/19 11:16 AM  Result Value Ref Range   Creatinine, Urine 175 mg/dL   Total Protein, Urine 13 mg/dL   Protein Creatinine Ratio 0.07 0.00 - 0.15 mg/mg[Cre]  CBC on admission   Collection Time: 09/05/19 11:31  AM  Result Value Ref Range   WBC 7.7 4.0 - 10.5 K/uL   RBC 4.13 3.87 - 5.11 MIL/uL   Hemoglobin 11.9 (L) 12.0 - 15.0 g/dL   HCT 34.6 (L) 36.0 - 46.0 %   MCV 83.8 80.0 - 100.0 fL   MCH 28.8 26.0 - 34.0 pg   MCHC 34.4 30.0 - 36.0 g/dL   RDW 14.1 11.5 - 15.5 %   Platelets 116 (L) 150 - 400 K/uL   nRBC 0.0 0.0 - 0.2 %  Comprehensive metabolic panel   Collection Time: 09/05/19 11:31 AM  Result Value Ref Range   Sodium 135 135 - 145 mmol/L   Potassium 3.8 3.5 - 5.1 mmol/L   Chloride 105 98 - 111  mmol/L   CO2 19 (L) 22 - 32 mmol/L   Glucose, Bld 84 70 - 99 mg/dL   BUN 11 6 - 20 mg/dL   Creatinine, Ser 0.65 0.44 - 1.00 mg/dL   Calcium 9.1 8.9 - 10.3 mg/dL   Total Protein 6.4 (L) 6.5 - 8.1 g/dL   Albumin 3.2 (L) 3.5 - 5.0 g/dL   AST 17 15 - 41 U/L   ALT 11 0 - 44 U/L   Alkaline Phosphatase 71 38 - 126 U/L   Total Bilirubin 0.7 0.3 - 1.2 mg/dL   GFR calc non Af Amer >60 >60 mL/min   GFR calc Af Amer >60 >60 mL/min   Anion gap 11 5 - 15     Constitutional: NAD, AAOx3  HE/ENT: extraocular movements grossly intact, moist mucous membranes CV: RRR PULM: nl respiratory effort, CTABL     Abd: gravid, non-tender, non-distended, soft      Ext: Non-tender, Nonedmeatous   Psych: mood appropriate, speech normal Pelvic deferred  NST: Reactive Baseline: 120 Variability: moderate Accelerations present x >2 Decelerations absent Time 31mins    Assessment: 33 y.o. [redacted]w[redacted]d here for antenatal surveillance during pregnancy.  Principle diagnosis: transient elevated bp . Labs normal . BP normalized   Plan:  Pt reassurred   Fetal Wellbeing: Reassuring Cat 1 tracing.  Reactive NST   D/c home stable, precautions reviewed, follow-up as scheduled.   ----- Huel Cote MD Attending Obstetrician and Gynecologist [facility], Department of [facility] Patient ID: Charlene Garcia, female   DOB: Apr 24, 1986, 33 y.o.   MRN: [id number]

## 2019-09-05 NOTE — Progress Notes (Signed)
Pt [redacted]w[redacted]d G2P1 sent from the office for elevated BP. Pt reports she had HA and floaters last week but has not had either in the last week days. Pt reports mild cramping today in her lower abd. Reports she has not been drinking much water. Pt reports she is GDM diet controlled. Reports +FM. Denies bleeding/LOF. Seq BP. Will continue to monitor.

## 2019-09-05 NOTE — Discharge Summary (Signed)
Charlene Garcia is a 33 y.o. female. She is at [redacted]w[redacted]d gestation. Patient's last menstrual period was 01/01/2019 (approximate).  Estimated Date of Delivery: 10/13/19     Prenatal care site: [place] from clinic for elevated BP . 140/89/90's . No symptoms   Location:  Onset/timing:  Duration:  Quality:   Severity:  Aggravating or alleviating conditions:  Associated signs/symptoms:  Context:     S: Resting comfortably. no CTX, no VB.no LOF,  Active fetal movement.       Maternal Medical History:            Past Medical History:    Diagnosis   Date    .   GERD (gastroesophageal reflux disease)        .   Gestational diabetes        .   Herpes simplex infection            Inside mouth    .   History of abnormal Pap smear   2013        HPV    .   No pertinent past medical history                   Past Surgical History:    Procedure   Laterality   Date    .   CESAREAN SECTION                2009    .   EXCISION MASS UPPER EXTREMETIES   Right   09/01/2017        Procedure: EXCISION AXILLARY MASS;  Surgeon: Leonie Green, MD;  Location: [facility];  Service: General;  Laterality: Right;    .   LAPAROSCOPY       06/30/2012        Procedure: LAPAROSCOPY OPERATIVE;  Surgeon: Donnamae Jude, MD;  Location: [facility];  Service: Gynecology;  Laterality: N/A;  with peritoneal biopsy    .   WISDOM TOOTH EXTRACTION       2013        x1              Allergies    Allergen   Reactions    .   Ortho-Novum 1-35 (28) [Norethin-Eth Estrad Triphasic]   Swelling                 Prior to Admission medications     Medication   Sig   Start Date   End Date   Taking?   Authorizing Provider    Prenatal 28-0.8 MG TABS   Take 1 tablet by mouth daily.            Yes   [provider]     valACYclovir (VALTREX) 500 MG tablet   Take 500 mg by mouth as needed.    04/13/19       Yes   [provider]    acetaminophen (TYLENOL) 500 MG tablet   Take 1,000 mg every 6 (six) hours as needed by mouth for moderate pain or headache.               [provider]    EPINEPHrine 0.3 mg/0.3 mL IJ SOAJ injection   INJECT INTO THIGH MUSCLE AS NEEDED FOR ANAPHYLAXIS.   10/16/16           [provider]    VELTIN gel  Apply 1 application topically daily as needed.    11/03/18           [provider]             Social History: She  reports that she has never smoked. She has never used smokeless tobacco. She reports that she does not drink alcohol or use drugs.     Family History: family history includes Heart disease in her brother, maternal grandfather, and mother.  no history of gyn cancers     Review of Systems: A full review of systems was performed and negative except as noted in the HPI.    Review of Systems: A full review of systems was performed and negative except as noted in the HPI.    Eyes: no vision change   Ears: left ear pain   Oropharynx: no sore throat   Pulmonary . No shortness of breath , no hemoptysis  Cardiovascular: no chest pain , no irregular heart beat   Gastrointestinal:no blood in stool . No diarrhea, no constipation  Uro gynecologic: no dysuria , no pelvic pain  Neurologic : no seizure , no migraines     Musculoskeletal: no muscular weakness     O:       Objective  Constitutional: NAD, AAOx3   HE/ENT: extraocular movements grossly intact, moist mucous membranes  CV: RRR  PULM: nl respiratory effort, CTABL                                          Abd: gravid, non-tender, non-distended, soft                                                   Ext: Non-tender, Nonedmeatous                      Psych: mood appropriate, speech normal  Pelvic deferred     NST: Reactive  Baseline: 120  Variability: moderate  Accelerations present x >2  Decelerations absent  Time 4mins           Assessment: 33 y.o. [redacted]w[redacted]d here for antenatal surveillance during pregnancy.     Principle diagnosis: transient elevated bp . Labs normal . BP normalized      Plan:  .Pt reassurred    .Fetal Wellbeing: Reassuring Cat 1 tracing.   .Reactive NST    .D/c home stable, precautions reviewed, follow-up as scheduled.       -----  Huel Cote MD  Attending Obstetrician and Gynecologist  [facility], Department of [facility]  Patient ID: Memoree Ravis, female   DOB: 1986/03/26, 33 y.o.   MRN: [id number]             Electronically signed by Boykin Nearing, MD at 09/05/2019  2:03 PM

## 2019-09-05 NOTE — Progress Notes (Signed)
Factors complicating this pregnancy  1. Previous LTCS 2009  FTP at [facility] by [facility]  need recs request for Op report [ ]  2. Obesity, BMI 36.3  Early GTT [140], Hgb A1C [ 5.4 ]   p/c ratio [ wnl ], CMP [ wnl ]  TSH [ 3.244 on 6/8]- plan recheck 2nd trimester [ ]    Elevated early 1h OGTT--> 3. gestational diabetes  3h OGTT 04/05/2019: 94, 207, 170, 65  Lifestyles referral placed 04/05/2019 mah  History of fetal macrosomia   4167g, 9lb 3oz

## 2019-09-12 ENCOUNTER — Observation Stay
Admission: EM | Admit: 2019-09-12 | Discharge: 2019-09-12 | Disposition: A | Discharge status: Home / Self Care | Payer: Medicaid Other | Attending: Certified Nurse Midwife | Admitting: Certified Nurse Midwife

## 2019-09-12 ENCOUNTER — Other Ambulatory Visit: Payer: Self-pay

## 2019-09-12 ENCOUNTER — Other Ambulatory Visit: Payer: Self-pay | Admitting: Certified Nurse Midwife

## 2019-09-12 ENCOUNTER — Encounter: Payer: Self-pay | Admitting: Certified Nurse Midwife

## 2019-09-12 DIAGNOSIS — O133 Gestational [pregnancy-induced] hypertension without significant proteinuria, third trimester: Secondary | ICD-10-CM | POA: Diagnosis not present

## 2019-09-12 DIAGNOSIS — Z3A35 35 weeks gestation of pregnancy: Secondary | ICD-10-CM | POA: Diagnosis not present

## 2019-09-12 DIAGNOSIS — Z8249 Family history of ischemic heart disease and other diseases of the circulatory system: Secondary | ICD-10-CM | POA: Insufficient documentation

## 2019-09-12 DIAGNOSIS — Z888 Allergy status to other drugs, medicaments and biological substances status: Secondary | ICD-10-CM | POA: Diagnosis not present

## 2019-09-12 DIAGNOSIS — B0089 Other herpesviral infection: Secondary | ICD-10-CM | POA: Insufficient documentation

## 2019-09-12 DIAGNOSIS — O98513 Other viral diseases complicating pregnancy, third trimester: Secondary | ICD-10-CM | POA: Insufficient documentation

## 2019-09-12 DIAGNOSIS — O99113 Other diseases of the blood and blood-forming organs and certain disorders involving the immune mechanism complicating pregnancy, third trimester: Secondary | ICD-10-CM | POA: Insufficient documentation

## 2019-09-12 DIAGNOSIS — O24410 Gestational diabetes mellitus in pregnancy, diet controlled: Secondary | ICD-10-CM | POA: Diagnosis not present

## 2019-09-12 DIAGNOSIS — O163 Unspecified maternal hypertension, third trimester: Secondary | ICD-10-CM | POA: Diagnosis present

## 2019-09-12 DIAGNOSIS — D696 Thrombocytopenia, unspecified: Secondary | ICD-10-CM | POA: Insufficient documentation

## 2019-09-12 LAB — COMPREHENSIVE METABOLIC PANEL
ALT: 9 U/L (ref 0–44)
AST: 15 U/L (ref 15–41)
Albumin: 3.1 g/dL — ABNORMAL LOW (ref 3.5–5.0)
Alkaline Phosphatase: 71 U/L (ref 38–126)
Anion gap: 10 (ref 5–15)
BUN: 12 mg/dL (ref 6–20)
CO2: 19 mmol/L — ABNORMAL LOW (ref 22–32)
Calcium: 8.9 mg/dL (ref 8.9–10.3)
Chloride: 108 mmol/L (ref 98–111)
Creatinine, Ser: 0.74 mg/dL (ref 0.44–1.00)
GFR calc Af Amer: >60 mL/min (ref >60)
GFR calc non Af Amer: >60 mL/min (ref >60)
Glucose, Bld: 83 mg/dL (ref 70–99)
Potassium: 4 mmol/L (ref 3.5–5.1)
Sodium: 137 mmol/L (ref 135–145)
Total Bilirubin: 0.5 mg/dL (ref 0.3–1.2)
Total Protein: 6.1 g/dL — ABNORMAL LOW (ref 6.5–8.1)

## 2019-09-12 LAB — TYPE AND SCREEN
ABO/RH(D): A POS
Antibody Screen: NEGATIVE

## 2019-09-12 LAB — CBC
HCT: 35.1 % — ABNORMAL LOW (ref 36.0–46.0)
Hemoglobin: 11.7 g/dL — ABNORMAL LOW (ref 12.0–15.0)
MCH: 29 pg (ref 26.0–34.0)
MCHC: 33.3 g/dL (ref 30.0–36.0)
MCV: 87.1 fL (ref 80.0–100.0)
Platelets: 112 10*3/uL — ABNORMAL LOW (ref 150–400)
RBC: 4.03 MIL/uL (ref 3.87–5.11)
RDW: 14.1 % (ref 11.5–15.5)
WBC: 9.2 10*3/uL (ref 4.0–10.5)
nRBC: 0 % (ref 0.0–0.2)

## 2019-09-12 LAB — PROTEIN / CREATININE RATIO, URINE
Creatinine, Urine: 164 mg/dL
Protein Creatinine Ratio: 0.07 mg/mg{Cre} (ref 0.00–0.15)
Total Protein, Urine: 11 mg/dL

## 2019-09-12 MED ORDER — ACETAMINOPHEN 325 MG PO TABS: 650.0000 mg | ORAL_TABLET | ORAL | Status: DC | PRN

## 2019-09-12 NOTE — Progress Notes (Signed)
  Charlene Garcia is a 33 y.o. G2P1001 female dated by [redacted]w[redacted]d ultrasound on 02/21/2019.    Pregnancy Issues: 1. Gestational hypertension 2. Prior LTCS, desires TOLAC 3. Prepregnancy BMI 36.3 4. Gestational diabetes, diet controlled 5. History of fetal macrosomia, 9lb3oz 4147g 6. Anxiety 7. Thrombocytopenia 8. History of oral HSV   Prenatal care site: [facility]   Prenatal Labs: Blood type/Rh A+  Antibody screen neg  Rubella Immune  Varicella Immune  RPR NR  HBsAg Neg  HIV NR  GC neg  Chlamydia neg  Genetic screening negative  1 hour GTT 140  3 hour GTT 94, 270, 170, 87  GBS pending   Post Partum Planning: - Infant feeding: breastfeeding - Contraception: ? BTL

## 2019-09-12 NOTE — Discharge Instructions (Signed)
Please call [phone] [clinic M-F 8:30a-4:30p] or [phone] [L&D] if you have any of the following symptoms: -Swelling of the face or hands -Headache that will not go away -Blurry vision, white or black spots in your vision, bright lights/stars in your vision -Pain on the right, upper side of your abdomen that aches and is not from the baby's feet kicking you -Pain in the upper, middle of your abdomen, directly under your ribcage -Nausea or vomiting/unable to keep food or liquids down -Difficulty breathing -Top number 160 or higher OR bottom number 110 or higher when you check your blood pressure

## 2019-09-12 NOTE — Discharge Summary (Signed)
RN to review discharge instructions with patient. Gave patient opportunity for questions. All questions answered at this time. Pt verbalized understanding. Pt discharged home with her significant other.

## 2019-09-12 NOTE — OB Triage Note (Signed)
Pt sent from office for PIH eval. Pt states she has a slight headache. Denies any blurry vision or epigastric pain. +2 reflexes, no swelling, no clonus, lungs clear. Blood pressures cycling q15 mins. Denies ctx. Denies LOF or bleeding. Other vitals WNL, Reports positive fetal movement. Will continue to monitor.

## 2019-09-12 NOTE — Discharge Summary (Signed)
Charlene Garcia is a 33 y.o. female. She is at [redacted]w[redacted]d gestation. Patient's last menstrual period was 01/01/2019 (approximate). Estimated Date of Delivery: 10/13/19  Prenatal care site: [facility]   Chief complaint: Elevated BP in the office today Duration: intemittent Quality: elevated blood pressure Severity: mild to severe Aggravating or alleviating conditions: none Associated signs/symptoms: none Context: Charlene Garcia has had some mild range elevated BPs in the office this pregnancy. She has been checking her blood pressures at home, where they run normal to mild range. Today in the office, her blood pressure was 168/115 with repeat of 164/104. She was instructed to come to triage for further evaluation.   S: Resting comfortably.   She reports:  -active fetal movement -no leakage of fluid -no vaginal bleeding -no contractions -slight headache, but no vision changes, no RUQ or epigastric pain, no chest pain, and no shortness of breath  Pregnancy issues: 1. Gestational hypertension 2. Prior LTCS, desires TOLAC 3. Prepregnancy BMI 36.3 4. Gestational diabetes, diet controlled 5. History of fetal macrosomia, 9lb3oz 4147g 6. Anxiety 7. Thrombocytopenia 8. History of oral HSV  Maternal Medical History:   Past Medical History:  Diagnosis Date  . GERD (gastroesophageal reflux disease)   . Gestational diabetes   . Herpes simplex infection    Inside mouth  . History of abnormal Pap smear 2013   HPV  . No pertinent past medical history     Past Surgical History:  Procedure Laterality Date  . CESAREAN SECTION     2009  . EXCISION MASS UPPER EXTREMETIES Right 09/01/2017   Procedure: EXCISION AXILLARY MASS;  Surgeon: Leonie Green, MD;  Location: [facility] ORS;  Service: General;  Laterality: Right;  . LAPAROSCOPY  06/30/2012   Procedure: LAPAROSCOPY OPERATIVE;  Surgeon: Donnamae Jude, MD;  Location: [facility];  Service: Gynecology;  Laterality: N/A;  with  peritoneal biopsy  . WISDOM TOOTH EXTRACTION  2013   x1    Allergies  Allergen Reactions  . Ortho-Novum 1-35 (28) [Norethin-Eth Estrad Triphasic] Swelling    Prior to Admission medications   Medication Sig Start Date End Date Taking? Authorizing Provider  acetaminophen (TYLENOL) 500 MG tablet Take 1,000 mg every 6 (six) hours as needed by mouth for moderate pain or headache.   Yes [provider]  Prenatal 28-0.8 MG TABS Take 1 tablet by mouth daily.    Yes [provider]  valACYclovir (VALTREX) 500 MG tablet Take 500 mg by mouth as needed.  04/13/19  Yes [provider]  EPINEPHrine 0.3 mg/0.3 mL IJ SOAJ injection INJECT INTO THIGH MUSCLE AS NEEDED FOR ANAPHYLAXIS. 10/16/16   [provider]  VELTIN gel Apply 1 application topically daily as needed.  11/03/18   [provider]     Social History: She  reports that she has never smoked. She has never used smokeless tobacco. She reports that she does not drink alcohol or use drugs.  Family History: family history includes Heart disease in her brother, maternal grandfather, and mother.  Review of Systems: A full review of systems was performed and negative except as noted in the HPI.     O:  BP 122/84   Pulse 85   Temp 97.7 F (36.5 C) (Oral)   Resp 16   Ht 5\' 3"  (1.6 m)   Wt 100.7 kg   LMP 01/01/2019 (Approximate)   SpO2 99%   BMI 39.33 kg/m  Results for orders placed or performed during the hospital encounter of  09/12/19 (from the past 48 hour(s))  Protein / creatinine ratio, urine   Collection Time: 09/12/19 10:45 AM  Result Value Ref Range   Creatinine, Urine 164 mg/dL   Total Protein, Urine 11 mg/dL   Protein Creatinine Ratio 0.07 0.00 - 0.15 mg/mg[Cre]  Comprehensive metabolic panel   Collection Time: 09/12/19 10:49 AM  Result Value Ref Range   Sodium 137 135 - 145 mmol/L   Potassium 4.0 3.5 - 5.1 mmol/L   Chloride 108 98 - 111 mmol/L   CO2 19 (L) 22 - 32 mmol/L    Glucose, Bld 83 70 - 99 mg/dL   BUN 12 6 - 20 mg/dL   Creatinine, Ser 0.74 0.44 - 1.00 mg/dL   Calcium 8.9 8.9 - 10.3 mg/dL   Total Protein 6.1 (L) 6.5 - 8.1 g/dL   Albumin 3.1 (L) 3.5 - 5.0 g/dL   AST 15 15 - 41 U/L   ALT 9 0 - 44 U/L   Alkaline Phosphatase 71 38 - 126 U/L   Total Bilirubin 0.5 0.3 - 1.2 mg/dL   GFR calc non Af Amer >60 >60 mL/min   GFR calc Af Amer >60 >60 mL/min   Anion gap 10 5 - 15  CBC on admission   Collection Time: 09/12/19 10:49 AM  Result Value Ref Range   WBC 9.2 4.0 - 10.5 K/uL   RBC 4.03 3.87 - 5.11 MIL/uL   Hemoglobin 11.7 (L) 12.0 - 15.0 g/dL   HCT 35.1 (L) 36.0 - 46.0 %   MCV 87.1 80.0 - 100.0 fL   MCH 29.0 26.0 - 34.0 pg   MCHC 33.3 30.0 - 36.0 g/dL   RDW 14.1 11.5 - 15.5 %   Platelets 112 (L) 150 - 400 K/uL   nRBC 0.0 0.0 - 0.2 %  Type and screen [place]   Collection Time: 09/12/19 10:49 AM  Result Value Ref Range   ABO/RH(D) A POS    Antibody Screen NEG    Sample Expiration      09/15/2019,2359 Performed at [facility], [address] [place], [place] [address]      Constitutional: NAD, AAOx3  HE/ENT: extraocular movements grossly intact, moist mucous membranes CV: RRR PULM: normal respiratory effort, CTABL     Abd: gravid, non-tender, non-distended, soft      Ext: Non-tender, Nonedmeatous   Psych: mood appropriate, speech normal Pelvic deferred  NST/fetal monitoring:  Baseline: 125bpm Variability: moderate Accelerations: 15x15 present regularly Decelerations: absent Time: 4 hours Toco: quiet   A/P: 33 y.o. [redacted]w[redacted]d here for antenatal surveillance during pregnancy.  Principle diagnosis: Gestational hypertension  Labor  Not present  Fetal Wellbeing  Reactive NST, reassuring for GA  Gestational hypertension  CBC, CMP, and P/C ratio with no evidence of preelcampsia  TOLAC IOL scheduled for 09/24/2019 at 0800 with Dr. Leonides Schanz  Thrombocytopenia  Platelets 112k today, 116k 1 week  ago, 146k on 07/20/2019  D/c home stable, precautions reviewed, follow-up as scheduled.    Patient history, labs, and vitals reviewed with Dr. Leonides Schanz, who is in agreement with plan for discharge home, close outpatient monitoring, and IOL at 37 weeks.  Lisette Grinder 09/12/2019 2:46 PM  ----- Lisette Grinder, CNM Certified Nurse Midwife [facility], Department of [facility]

## 2019-09-15 LAB — OB RESULTS CONSOLE GBS: GBS: NEGATIVE

## 2019-09-15 LAB — OB RESULTS CONSOLE RPR: RPR: NONREACTIVE

## 2019-09-22 ENCOUNTER — Inpatient Hospital Stay
Admission: EM | Admit: 2019-09-22 | Discharge: 2019-09-26 | DRG: 784 | Disposition: A | Discharge status: Home / Self Care | Payer: Medicaid Other | Attending: Obstetrics and Gynecology | Admitting: Obstetrics and Gynecology

## 2019-09-22 ENCOUNTER — Other Ambulatory Visit: Payer: Self-pay

## 2019-09-22 ENCOUNTER — Encounter: Payer: Self-pay | Admitting: Obstetrics & Gynecology

## 2019-09-22 ENCOUNTER — Other Ambulatory Visit
Admission: RE | Admit: 2019-09-22 | Discharge: 2019-09-22 | Disposition: A | Discharge status: Home / Self Care | Payer: Medicaid Other | Source: Ambulatory Visit | Attending: Obstetrics & Gynecology | Admitting: Obstetrics & Gynecology

## 2019-09-22 DIAGNOSIS — F419 Anxiety disorder, unspecified: Secondary | ICD-10-CM | POA: Diagnosis present

## 2019-09-22 DIAGNOSIS — Z01812 Encounter for preprocedural laboratory examination: Secondary | ICD-10-CM | POA: Insufficient documentation

## 2019-09-22 DIAGNOSIS — Z302 Encounter for sterilization: Secondary | ICD-10-CM

## 2019-09-22 DIAGNOSIS — O24420 Gestational diabetes mellitus in childbirth, diet controlled: Secondary | ICD-10-CM | POA: Diagnosis present

## 2019-09-22 DIAGNOSIS — O1414 Severe pre-eclampsia complicating childbirth: Secondary | ICD-10-CM | POA: Diagnosis present

## 2019-09-22 DIAGNOSIS — Z20828 Contact with and (suspected) exposure to other viral communicable diseases: Secondary | ICD-10-CM | POA: Diagnosis present

## 2019-09-22 DIAGNOSIS — O163 Unspecified maternal hypertension, third trimester: Secondary | ICD-10-CM

## 2019-09-22 DIAGNOSIS — D62 Acute posthemorrhagic anemia: Secondary | ICD-10-CM | POA: Diagnosis not present

## 2019-09-22 DIAGNOSIS — Z3A37 37 weeks gestation of pregnancy: Secondary | ICD-10-CM | POA: Diagnosis not present

## 2019-09-22 DIAGNOSIS — O34211 Maternal care for low transverse scar from previous cesarean delivery: Secondary | ICD-10-CM | POA: Diagnosis present

## 2019-09-22 DIAGNOSIS — O9832 Other infections with a predominantly sexual mode of transmission complicating childbirth: Secondary | ICD-10-CM | POA: Diagnosis present

## 2019-09-22 DIAGNOSIS — O9912 Other diseases of the blood and blood-forming organs and certain disorders involving the immune mechanism complicating childbirth: Secondary | ICD-10-CM | POA: Diagnosis present

## 2019-09-22 DIAGNOSIS — A6000 Herpesviral infection of urogenital system, unspecified: Secondary | ICD-10-CM | POA: Diagnosis present

## 2019-09-22 DIAGNOSIS — O9089 Other complications of the puerperium, not elsewhere classified: Secondary | ICD-10-CM | POA: Diagnosis not present

## 2019-09-22 DIAGNOSIS — D696 Thrombocytopenia, unspecified: Secondary | ICD-10-CM | POA: Diagnosis present

## 2019-09-22 DIAGNOSIS — O9081 Anemia of the puerperium: Secondary | ICD-10-CM | POA: Diagnosis not present

## 2019-09-22 DIAGNOSIS — R000 Tachycardia, unspecified: Secondary | ICD-10-CM | POA: Diagnosis not present

## 2019-09-22 DIAGNOSIS — O99344 Other mental disorders complicating childbirth: Secondary | ICD-10-CM | POA: Diagnosis present

## 2019-09-22 DIAGNOSIS — Z98891 History of uterine scar from previous surgery: Secondary | ICD-10-CM

## 2019-09-22 DIAGNOSIS — O134 Gestational [pregnancy-induced] hypertension without significant proteinuria, complicating childbirth: Secondary | ICD-10-CM | POA: Diagnosis present

## 2019-09-22 LAB — CBC
HCT: 35.5 % — ABNORMAL LOW (ref 36.0–46.0)
Hemoglobin: 12.1 g/dL (ref 12.0–15.0)
MCH: 29.6 pg (ref 26.0–34.0)
MCHC: 34.1 g/dL (ref 30.0–36.0)
MCV: 86.8 fL (ref 80.0–100.0)
Platelets: 118 10*3/uL — ABNORMAL LOW (ref 150–400)
RBC: 4.09 MIL/uL (ref 3.87–5.11)
RDW: 14.6 % (ref 11.5–15.5)
WBC: 9.5 10*3/uL (ref 4.0–10.5)
nRBC: 0 % (ref 0.0–0.2)

## 2019-09-22 LAB — COMPREHENSIVE METABOLIC PANEL
ALT: 11 U/L (ref 0–44)
AST: 18 U/L (ref 15–41)
Albumin: 3.5 g/dL (ref 3.5–5.0)
Alkaline Phosphatase: 87 U/L (ref 38–126)
Anion gap: 12 (ref 5–15)
BUN: 14 mg/dL (ref 6–20)
CO2: 19 mmol/L — ABNORMAL LOW (ref 22–32)
Calcium: 9.2 mg/dL (ref 8.9–10.3)
Chloride: 107 mmol/L (ref 98–111)
Creatinine, Ser: 0.8 mg/dL (ref 0.44–1.00)
GFR calc Af Amer: >60 mL/min (ref >60)
GFR calc non Af Amer: >60 mL/min (ref >60)
Glucose, Bld: 116 mg/dL — ABNORMAL HIGH (ref 70–99)
Potassium: 3.9 mmol/L (ref 3.5–5.1)
Sodium: 138 mmol/L (ref 135–145)
Total Bilirubin: 0.7 mg/dL (ref 0.3–1.2)
Total Protein: 6.7 g/dL (ref 6.5–8.1)

## 2019-09-22 LAB — TYPE AND SCREEN
ABO/RH(D): A POS
Antibody Screen: NEGATIVE

## 2019-09-22 LAB — SARS CORONAVIRUS 2 (TAT 6-24 HRS): SARS Coronavirus 2: NEGATIVE

## 2019-09-22 MED ORDER — LABETALOL HCL 5 MG/ML IV SOLN: 40.0000 mg | INTRAVENOUS | Status: DC | PRN

## 2019-09-22 MED ORDER — BUTORPHANOL TARTRATE 1 MG/ML IJ SOLN: 1.0000 mg | INTRAMUSCULAR | Status: DC | PRN

## 2019-09-22 MED ORDER — MAGNESIUM SULFATE BOLUS VIA INFUSION
4.0000 g | Freq: Once | INTRAVENOUS | Status: AC
  Administered 2019-09-22: 4 g via INTRAVENOUS
  Filled 2019-09-22: qty 1000

## 2019-09-22 MED ORDER — HYDRALAZINE HCL 20 MG/ML IJ SOLN: 10.0000 mg | INTRAMUSCULAR | Status: DC | PRN

## 2019-09-22 MED ORDER — ONDANSETRON HCL 4 MG/2ML IJ SOLN
4.0000 mg | Freq: Four times a day (QID) | INTRAMUSCULAR | Status: DC | PRN
  Administered 2019-09-23: 4 mg via INTRAVENOUS

## 2019-09-22 MED ORDER — LABETALOL HCL 5 MG/ML IV SOLN: 80.0000 mg | INTRAVENOUS | Status: DC | PRN

## 2019-09-22 MED ORDER — SOD CITRATE-CITRIC ACID 500-334 MG/5ML PO SOLN: 30.0000 mL | ORAL | Status: DC | PRN

## 2019-09-22 MED ORDER — AMMONIA AROMATIC IN INHA
RESPIRATORY_TRACT | Status: AC
  Filled 2019-09-22: qty 10

## 2019-09-22 MED ORDER — LIDOCAINE HCL (PF) 1 % IJ SOLN
30.0000 mL | INTRAMUSCULAR | Status: DC | PRN
  Filled 2019-09-22: qty 30

## 2019-09-22 MED ORDER — ACETAMINOPHEN 325 MG PO TABS
650.0000 mg | ORAL_TABLET | ORAL | Status: DC | PRN
  Administered 2019-09-22 – 2019-09-23 (×3): 650 mg via ORAL
  Filled 2019-09-22 (×3): qty 2

## 2019-09-22 MED ORDER — MISOPROSTOL 200 MCG PO TABS
ORAL_TABLET | ORAL | Status: AC
  Filled 2019-09-22: qty 4

## 2019-09-22 MED ORDER — LABETALOL HCL 5 MG/ML IV SOLN: 20.0000 mg | INTRAVENOUS | Status: DC | PRN

## 2019-09-22 MED ORDER — OXYTOCIN 40 UNITS IN NORMAL SALINE INFUSION - SIMPLE MED
2.5000 [IU]/h | INTRAVENOUS | Status: DC
  Administered 2019-09-23: 2.5 [IU]/h via INTRAVENOUS
  Filled 2019-09-22: qty 1000

## 2019-09-22 MED ORDER — LACTATED RINGERS IV SOLN
INTRAVENOUS | Status: DC
  Administered 2019-09-22 – 2019-09-23 (×2): via INTRAVENOUS

## 2019-09-22 MED ORDER — TERBUTALINE SULFATE 1 MG/ML IJ SOLN: 0.2500 mg | Freq: Once | INTRAMUSCULAR | Status: DC | PRN

## 2019-09-22 MED ORDER — OXYTOCIN BOLUS FROM INFUSION: 500.0000 mL | Freq: Once | INTRAVENOUS | Status: DC

## 2019-09-22 MED ORDER — MAGNESIUM SULFATE 40 GM/1000ML IV SOLN
2.0000 g/h | INTRAVENOUS | Status: DC
  Administered 2019-09-22 – 2019-09-24 (×3): 2 g/h via INTRAVENOUS
  Filled 2019-09-22 (×3): qty 1000

## 2019-09-22 MED ORDER — OXYTOCIN 40 UNITS IN NORMAL SALINE INFUSION - SIMPLE MED
1.0000 m[IU]/min | INTRAVENOUS | Status: DC
  Administered 2019-09-23: 2 m[IU]/min via INTRAVENOUS
  Filled 2019-09-22: qty 1000

## 2019-09-22 MED ORDER — LACTATED RINGERS IV SOLN: 500.0000 mL | INTRAVENOUS | Status: DC | PRN

## 2019-09-22 MED ORDER — OXYTOCIN 10 UNIT/ML IJ SOLN
INTRAMUSCULAR | Status: AC
  Filled 2019-09-22: qty 1

## 2019-09-22 NOTE — H&P (Signed)
OB History & Physical   History of Present Illness:  Chief Complaint: ELEVATED BP IN OFFICE 12/10  HPI:  Charlene Garcia is a 33 y.o. G2P1 female at [redacted]w[redacted]d dated by [redacted]w[redacted]d [place].  She presents to L&D for IOL due to [facility] with severe features- documented severe range BPs in office on 11/30 and 12/10. Reports intermittent HA off and on.  Reports active FM, denies UCs, LOF or VB.   Pregnancy Issues: 1. Gestational hypertension WITH SEVERE FEATURES 2. Prior LTCS, desiresTOLAC 3. Prepregnancy BMI 36.3 4. Gestational diabetes, diet controlled 5. History of fetal macrosomia, 9lb3oz 4147g 6. Anxiety 7. Thrombocytopenia 8. History of oral HSV   Maternal Medical History:   Past Medical History:  Diagnosis Date  . GERD (gastroesophageal reflux disease)   . Gestational diabetes   . Herpes simplex infection    Inside mouth  . History of abnormal Pap smear 2013   HPV  . No pertinent past medical history     Past Surgical History:  Procedure Laterality Date  . CESAREAN SECTION     2009  . EXCISION MASS UPPER EXTREMETIES Right 09/01/2017   Procedure: EXCISION AXILLARY MASS;  Surgeon: Leonie Green, MD;  Location: [facility] ORS;  Service: General;  Laterality: Right;  . LAPAROSCOPY  06/30/2012   Procedure: LAPAROSCOPY OPERATIVE;  Surgeon: Donnamae Jude, MD;  Location: [place] [facility];  Service: Gynecology;  Laterality: N/A;  with peritoneal biopsy  . WISDOM TOOTH EXTRACTION  2013   x1    Allergies  Allergen Reactions  . Ortho-Novum 1-35 (28) [Norethin-Eth Estrad Triphasic] Swelling    Prior to Admission medications   Medication Sig Start Date End Date Taking? Authorizing Provider  acetaminophen (TYLENOL) 500 MG tablet Take 1,000 mg every 6 (six) hours as needed by mouth for moderate pain or headache.    [provider]  EPINEPHrine 0.3 mg/0.3 mL IJ SOAJ injection INJECT INTO THIGH MUSCLE AS NEEDED FOR ANAPHYLAXIS. 10/16/16   [provider]  Prenatal 28-0.8 MG TABS Take  1 tablet by mouth daily.     [provider]  valACYclovir (VALTREX) 500 MG tablet Take 500 mg by mouth as needed.  04/13/19   [provider]     Prenatal care site: [facility]  Social History: She  reports that she has never smoked. She has never used smokeless tobacco. She reports that she does not drink alcohol or use drugs.  Family History: family history includes Heart disease in her brother, maternal grandfather, and mother.   Review of Systems: A full review of systems was performed and negative except as noted in the HPI.     Physical Exam:  Vital Signs: BP (!) 141/107   Pulse (!) 106   Temp 98.2 F (36.8 C) (Oral)   Resp 16   Ht 5\' 3"  (1.6 m)   Wt 99.8 kg   LMP 01/01/2019 (Approximate)   BMI 38.97 kg/m  General: no acute distress.   HEENT: normocephalic, atraumatic Heart: regular rate & rhythm.  No murmurs/rubs/gallops Lungs: clear to auscultation bilaterally, normal respiratory effort Abdomen: soft, gravid, non-tender;  EFW: 7lbs Pelvic:   External: Normal external female genitalia, no HSV lesions  Cervix: deferred, 2 laminaria placed by Dr Leonides Schanz.    Extremities: non-tender, symmetric, no edema bilaterally.  DTRs: 2+  Neurologic: Alert & oriented x 3.    Results for orders placed or performed during the hospital encounter of 09/22/19 (from the past 24 hour(s))  CBC  Status: Abnormal   Collection Time: 09/22/19  7:43 PM  Result Value Ref Range   WBC 9.5 4.0 - 10.5 K/uL   RBC 4.09 3.87 - 5.11 MIL/uL   Hemoglobin 12.1 12.0 - 15.0 g/dL   HCT 35.5 (L) 36.0 - 46.0 %   MCV 86.8 80.0 - 100.0 fL   MCH 29.6 26.0 - 34.0 pg   MCHC 34.1 30.0 - 36.0 g/dL   RDW 14.6 11.5 - 15.5 %   Platelets 118 (L) 150 - 400 K/uL   nRBC 0.0 0.0 - 0.2 %    Pertinent Results:  Prenatal Labs: Blood type/Rh A+  Antibody screen neg  Rubella Immune  Varicella Immune  RPR NR  HBsAg Neg  HIV NR  GC neg  Chlamydia neg  Genetic screening negative  1  hour GTT 140  3 hour GTT 94, 270, 170, 87  GBS  neg    FHT: 120 bpm, moderate variability, + accels, no decels TOCO: Irregular SVE:  deferred   Cephalic by leopolds and confirmed with bedside US  No results found.  Assessment:  Dynastee Veverly Garcia is a 33 y.o. G2P1 female at [redacted]w[redacted]d with GHTN with severe features.   Plan:  1. Admit to Labor & Delivery; consents reviewed and obtained; POC d/w Dr Leafy Ro.  - COVID negative, tested today.  - GDM diet control, q4hr CBG - GHTN with severe range BPs; Magnesium started.   2. Fetal Well being  - Fetal Tracing: Cat I  - Group B Streptococcus ppx indicated: neg - Presentation: cephalic confirmed by Leopolds and bedside US.    3. Routine OB: - Prenatal labs reviewed, as above - Rh:  A Pos - CBC, T&S, RPR on admit - Clear fluids, IVF  4. Induction of Labor- TOLAC -  Contractions: external toco in place -  Pelvis unproven -  Plan for induction with Laminaria, placed at 1615 in office by Dr Leonides Schanz.  -  Plan for continuous fetal monitoring  -  Maternal pain control as desired; reviewed options - Anticipate vaginal delivery  5. Post Partum Planning: - Infant feeding: breast - Contraception: TBD   Murray Hodgkins Kylle Lall, CNM 09/22/19 8:00 PM

## 2019-09-23 ENCOUNTER — Encounter: Payer: Self-pay | Admitting: Obstetrics & Gynecology

## 2019-09-23 ENCOUNTER — Encounter
Admission: EM | Disposition: A | Discharge status: Home / Self Care | Payer: Self-pay | Source: Home / Self Care | Attending: Obstetrics and Gynecology

## 2019-09-23 ENCOUNTER — Inpatient Hospital Stay: Payer: Medicaid Other | Admitting: Registered Nurse

## 2019-09-23 LAB — PROTEIN / CREATININE RATIO, URINE
Creatinine, Urine: 111 mg/dL
Protein Creatinine Ratio: 0.11 mg/mg{Cre} (ref 0.00–0.15)
Total Protein, Urine: 12 mg/dL

## 2019-09-23 LAB — GLUCOSE, CAPILLARY
Glucose-Capillary: 105 mg/dL — ABNORMAL HIGH (ref 70–99)
Glucose-Capillary: 100 mg/dL — ABNORMAL HIGH (ref 70–99)
Glucose-Capillary: 132 mg/dL — ABNORMAL HIGH (ref 70–99)

## 2019-09-23 LAB — MAGNESIUM: Magnesium: 5.7 mg/dL — ABNORMAL HIGH (ref 1.7–2.4)

## 2019-09-23 LAB — RPR: RPR Ser Ql: NONREACTIVE

## 2019-09-23 SURGERY — Surgical Case
Anesthesia: Epidural | Site: Abdomen

## 2019-09-23 MED ORDER — KETOROLAC TROMETHAMINE 30 MG/ML IJ SOLN
INTRAMUSCULAR | Status: DC | PRN
  Administered 2019-09-23: 30 mg via INTRAVENOUS

## 2019-09-23 MED ORDER — KETAMINE HCL 50 MG/ML IJ SOLN
INTRAMUSCULAR | Status: AC
  Filled 2019-09-23: qty 10

## 2019-09-23 MED ORDER — LIDOCAINE HCL (PF) 1 % IJ SOLN
INTRAMUSCULAR | Status: DC | PRN
  Administered 2019-09-23: 10 mL
  Administered 2019-09-23: 5 mL
  Administered 2019-09-23: 3 mL
  Administered 2019-09-23: 10 mL

## 2019-09-23 MED ORDER — SODIUM CHLORIDE (PF) 0.9 % IJ SOLN
INTRAMUSCULAR | Status: AC
  Filled 2019-09-23: qty 50

## 2019-09-23 MED ORDER — MORPHINE SULFATE (PF) 2 MG/ML IV SOLN: 1.0000 mg | INTRAVENOUS | Status: AC | PRN

## 2019-09-23 MED ORDER — LACTATED RINGERS IV SOLN
INTRAVENOUS | Status: DC
  Administered 2019-09-24: 13:00:00 via INTRAVENOUS

## 2019-09-23 MED ORDER — GABAPENTIN 300 MG PO CAPS
300.0000 mg | ORAL_CAPSULE | Freq: Every day | ORAL | Status: DC
  Administered 2019-09-23 – 2019-09-25 (×3): 300 mg via ORAL
  Filled 2019-09-23 (×3): qty 1

## 2019-09-23 MED ORDER — CARBOPROST TROMETHAMINE 250 MCG/ML IM SOLN
INTRAMUSCULAR | Status: AC
  Filled 2019-09-23: qty 1

## 2019-09-23 MED ORDER — FENTANYL CITRATE (PF) 100 MCG/2ML IJ SOLN
INTRAMUSCULAR | Status: AC
  Filled 2019-09-23: qty 2

## 2019-09-23 MED ORDER — LIDOCAINE-EPINEPHRINE (PF) 1.5 %-1:200000 IJ SOLN
INTRAMUSCULAR | Status: DC | PRN
  Administered 2019-09-23: 3 mL via EPIDURAL

## 2019-09-23 MED ORDER — FENTANYL 2.5 MCG/ML W/ROPIVACAINE 0.15% IN NS 100 ML EPIDURAL (ARMC)
EPIDURAL | Status: AC
  Filled 2019-09-23: qty 100

## 2019-09-23 MED ORDER — CARBOPROST TROMETHAMINE 250 MCG/ML IM SOLN
INTRAMUSCULAR | Status: DC | PRN
  Administered 2019-09-23: 250 ug via INTRAMUSCULAR

## 2019-09-23 MED ORDER — PRENATAL MULTIVITAMIN CH
1.0000 | ORAL_TABLET | Freq: Every day | ORAL | Status: DC
  Administered 2019-09-24 – 2019-09-25 (×2): 1 via ORAL
  Filled 2019-09-23 (×3): qty 1

## 2019-09-23 MED ORDER — PROPOFOL 10 MG/ML IV BOLUS
INTRAVENOUS | Status: AC
  Filled 2019-09-23: qty 20

## 2019-09-23 MED ORDER — SOD CITRATE-CITRIC ACID 500-334 MG/5ML PO SOLN
ORAL | Status: AC
  Administered 2019-09-23: 30 mL via ORAL
  Filled 2019-09-23: qty 30

## 2019-09-23 MED ORDER — ENOXAPARIN SODIUM 40 MG/0.4ML ~~LOC~~ SOLN
40.0000 mg | SUBCUTANEOUS | Status: DC
  Administered 2019-09-24 – 2019-09-25 (×2): 40 mg via SUBCUTANEOUS
  Filled 2019-09-23 (×2): qty 0.4

## 2019-09-23 MED ORDER — SENNOSIDES-DOCUSATE SODIUM 8.6-50 MG PO TABS
2.0000 | ORAL_TABLET | ORAL | Status: DC
  Administered 2019-09-24: 1 via ORAL
  Administered 2019-09-25 – 2019-09-26 (×2): 2 via ORAL
  Filled 2019-09-23 (×3): qty 2

## 2019-09-23 MED ORDER — PHENYLEPHRINE HCL-NACL 20-0.9 MG/250ML-% IV SOLN
INTRAVENOUS | Status: DC | PRN
  Administered 2019-09-23: 50 ug/min via INTRAVENOUS

## 2019-09-23 MED ORDER — MORPHINE SULFATE (PF) 0.5 MG/ML IJ SOLN
INTRAMUSCULAR | Status: DC | PRN
  Administered 2019-09-23: 3 mg via EPIDURAL

## 2019-09-23 MED ORDER — SIMETHICONE 80 MG PO CHEW
80.0000 mg | CHEWABLE_TABLET | ORAL | Status: DC
  Administered 2019-09-25: 80 mg via ORAL
  Filled 2019-09-23: qty 1

## 2019-09-23 MED ORDER — ONDANSETRON HCL 4 MG/2ML IJ SOLN
INTRAMUSCULAR | Status: AC
  Filled 2019-09-23: qty 2

## 2019-09-23 MED ORDER — SIMETHICONE 80 MG PO CHEW
80.0000 mg | CHEWABLE_TABLET | ORAL | Status: DC | PRN
  Administered 2019-09-24 (×2): 80 mg via ORAL
  Filled 2019-09-23: qty 1

## 2019-09-23 MED ORDER — ACETAMINOPHEN 500 MG PO TABS
1000.0000 mg | ORAL_TABLET | Freq: Once | ORAL | Status: AC
  Administered 2019-09-23: 1000 mg via ORAL

## 2019-09-23 MED ORDER — FENTANYL 2.5 MCG/ML W/ROPIVACAINE 0.15% IN NS 100 ML EPIDURAL (ARMC)
EPIDURAL | Status: DC | PRN
  Administered 2019-09-23: 12 mL/h via EPIDURAL

## 2019-09-23 MED ORDER — OXYCODONE HCL 5 MG PO TABS
5.0000 mg | ORAL_TABLET | ORAL | Status: DC | PRN
  Administered 2019-09-24 – 2019-09-25 (×2): 5 mg via ORAL
  Filled 2019-09-23 (×2): qty 1

## 2019-09-23 MED ORDER — CEFAZOLIN SODIUM-DEXTROSE 2-4 GM/100ML-% IV SOLN
2.0000 g | Freq: Once | INTRAVENOUS | Status: DC
  Filled 2019-09-23: qty 100

## 2019-09-23 MED ORDER — BUPIVACAINE LIPOSOME 1.3 % IJ SUSP: 20.0000 mL | Freq: Once | INTRAMUSCULAR | Status: DC

## 2019-09-23 MED ORDER — BUPIVACAINE HCL (PF) 0.5 % IJ SOLN
INTRAMUSCULAR | Status: AC
  Filled 2019-09-23: qty 30

## 2019-09-23 MED ORDER — DIPHENHYDRAMINE HCL 25 MG PO CAPS: 25.0000 mg | ORAL_CAPSULE | Freq: Four times a day (QID) | ORAL | Status: DC | PRN

## 2019-09-23 MED ORDER — ACETAMINOPHEN 500 MG PO TABS
ORAL_TABLET | ORAL | Status: AC
  Filled 2019-09-23: qty 2

## 2019-09-23 MED ORDER — OXYTOCIN 40 UNITS IN NORMAL SALINE INFUSION - SIMPLE MED
INTRAVENOUS | Status: DC | PRN
  Administered 2019-09-23: 1000 mL via INTRAVENOUS

## 2019-09-23 MED ORDER — OXYTOCIN 40 UNITS IN NORMAL SALINE INFUSION - SIMPLE MED: 2.5000 [IU]/h | INTRAVENOUS | Status: AC

## 2019-09-23 MED ORDER — DIBUCAINE (PERIANAL) 1 % EX OINT: 1.0000 "application " | TOPICAL_OINTMENT | CUTANEOUS | Status: DC | PRN

## 2019-09-23 MED ORDER — FENTANYL CITRATE (PF) 250 MCG/5ML IJ SOLN
INTRAMUSCULAR | Status: DC | PRN
  Administered 2019-09-23 (×2): 50 ug via INTRAVENOUS

## 2019-09-23 MED ORDER — CEFAZOLIN SODIUM-DEXTROSE 2-3 GM-%(50ML) IV SOLR
INTRAVENOUS | Status: DC | PRN
  Administered 2019-09-23: 2 g via INTRAVENOUS

## 2019-09-23 MED ORDER — SODIUM CHLORIDE FLUSH 0.9 % IV SOLN
INTRAVENOUS | Status: DC | PRN
  Administered 2019-09-23: 19:00:00 100 mL

## 2019-09-23 MED ORDER — ZOLPIDEM TARTRATE 5 MG PO TABS: 5.0000 mg | ORAL_TABLET | Freq: Every evening | ORAL | Status: DC | PRN

## 2019-09-23 MED ORDER — EPHEDRINE SULFATE 50 MG/ML IJ SOLN
INTRAMUSCULAR | Status: DC | PRN
  Administered 2019-09-23: 25 mg via INTRAVENOUS

## 2019-09-23 MED ORDER — BUPIVACAINE LIPOSOME 1.3 % IJ SUSP
INTRAMUSCULAR | Status: AC
  Filled 2019-09-23: qty 20

## 2019-09-23 MED ORDER — COCONUT OIL OIL
1.0000 "application " | TOPICAL_OIL | Status: DC | PRN
  Administered 2019-09-25: 1 via TOPICAL
  Filled 2019-09-23: qty 120

## 2019-09-23 MED ORDER — PHENYLEPHRINE HCL (PRESSORS) 10 MG/ML IV SOLN
INTRAVENOUS | Status: DC | PRN
  Administered 2019-09-23: 200 ug via INTRAVENOUS
  Administered 2019-09-23 (×3): 100 ug via INTRAVENOUS

## 2019-09-23 MED ORDER — SODIUM CHLORIDE 0.9 % IV SOLN
500.0000 mg | Freq: Once | INTRAVENOUS | Status: AC
  Administered 2019-09-23: 500 mg via INTRAVENOUS
  Filled 2019-09-23: qty 500

## 2019-09-23 MED ORDER — METHYLERGONOVINE MALEATE 0.2 MG/ML IJ SOLN
INTRAMUSCULAR | Status: AC
  Filled 2019-09-23: qty 1

## 2019-09-23 MED ORDER — MORPHINE SULFATE (PF) 0.5 MG/ML IJ SOLN
INTRAMUSCULAR | Status: AC
  Filled 2019-09-23: qty 10

## 2019-09-23 MED ORDER — ACETAMINOPHEN 500 MG PO TABS
1000.0000 mg | ORAL_TABLET | Freq: Four times a day (QID) | ORAL | Status: DC
  Administered 2019-09-23 – 2019-09-26 (×9): 1000 mg via ORAL
  Filled 2019-09-23 (×10): qty 2

## 2019-09-23 MED ORDER — KETAMINE HCL 10 MG/ML IJ SOLN
INTRAMUSCULAR | Status: DC | PRN
  Administered 2019-09-23 (×2): 25 mg via INTRAVENOUS

## 2019-09-23 MED ORDER — SIMETHICONE 80 MG PO CHEW
80.0000 mg | CHEWABLE_TABLET | Freq: Three times a day (TID) | ORAL | Status: DC
  Administered 2019-09-24 – 2019-09-26 (×6): 80 mg via ORAL
  Filled 2019-09-23 (×7): qty 1

## 2019-09-23 MED ORDER — MENTHOL 3 MG MT LOZG
1.0000 | LOZENGE | OROMUCOSAL | Status: DC | PRN
  Filled 2019-09-23: qty 9

## 2019-09-23 MED ORDER — MORPHINE SULFATE (PF) 2 MG/ML IV SOLN: 1.0000 mg | INTRAVENOUS | Status: DC | PRN

## 2019-09-23 MED ORDER — WITCH HAZEL-GLYCERIN EX PADS: 1.0000 "application " | MEDICATED_PAD | CUTANEOUS | Status: DC | PRN

## 2019-09-23 MED ORDER — BUPIVACAINE HCL (PF) 0.25 % IJ SOLN
INTRAMUSCULAR | Status: DC | PRN
  Administered 2019-09-23: 4 mL via EPIDURAL
  Administered 2019-09-23: 3 mL via EPIDURAL

## 2019-09-23 SURGICAL SUPPLY — 28 items
BARRIER ADHS 3X4 INTERCEED (GAUZE/BANDAGES/DRESSINGS) ×4 IMPLANT
CANISTER SUCT 3000ML PPV (MISCELLANEOUS) ×4 IMPLANT
CHLORAPREP W/TINT 26 (MISCELLANEOUS) ×4 IMPLANT
COVER WAND RF STERILE (DRAPES) ×4 IMPLANT
DRSG TELFA 3X8 NADH (GAUZE/BANDAGES/DRESSINGS) ×4 IMPLANT
ELECT CAUTERY BLADE 6.4 (BLADE) ×4 IMPLANT
ELECT REM PT RETURN 9FT ADLT (ELECTROSURGICAL) ×4
ELECTRODE REM PT RTRN 9FT ADLT (ELECTROSURGICAL) ×2 IMPLANT
EXTRACTOR VACUUM KIWI (MISCELLANEOUS) ×4 IMPLANT
GAUZE SPONGE 4X4 12PLY STRL (GAUZE/BANDAGES/DRESSINGS) ×4 IMPLANT
GLOVE BIO SURGEON STRL SZ8 (GLOVE) ×4 IMPLANT
GOWN STRL REUS W/ TWL LRG LVL3 (GOWN DISPOSABLE) ×4 IMPLANT
GOWN STRL REUS W/ TWL XL LVL3 (GOWN DISPOSABLE) ×2 IMPLANT
GOWN STRL REUS W/TWL LRG LVL3 (GOWN DISPOSABLE) ×4
GOWN STRL REUS W/TWL XL LVL3 (GOWN DISPOSABLE) ×2
NS IRRIG 1000ML POUR BTL (IV SOLUTION) ×4 IMPLANT
PACK C SECTION AR (MISCELLANEOUS) ×4 IMPLANT
PAD OB MATERNITY 4.3X12.25 (PERSONAL CARE ITEMS) ×4 IMPLANT
PAD PREP 24X41 OB/GYN DISP (PERSONAL CARE ITEMS) ×4 IMPLANT
PENCIL SMOKE ULTRAEVAC 22 CON (MISCELLANEOUS) ×4 IMPLANT
RETRACTOR TRAXI PANNICULUS (MISCELLANEOUS) IMPLANT
STAPLER INSORB 30 2030 C-SECTI (MISCELLANEOUS) IMPLANT
STRAP SAFETY 5IN WIDE (MISCELLANEOUS) ×4 IMPLANT
SUCT VACUUM KIWI BELL (SUCTIONS) IMPLANT
SUT CHROMIC 1 CTX 36 (SUTURE) ×12 IMPLANT
SUT PLAIN GUT 0 (SUTURE) ×8 IMPLANT
SUT VIC AB 0 CT1 36 (SUTURE) ×8 IMPLANT
TRAXI PANNICULUS RETRACTOR (MISCELLANEOUS)

## 2019-09-23 NOTE — Progress Notes (Signed)
Labor Progress Note  Charlene Garcia is a 33 y.o. G2P1 at [redacted]w[redacted]d admitted for induction of labor due to gestational hypertention with severe features.  Pregnancy complicated by: thrombocytopenia, Prior C/s, GDM, H/o Macrosomia, Anxiety  Subjective: Pt is experiencing a very bad HA.  She is worried about pushing with her headache.  She is comfortable with her epidural.   Objective: BP 119/88   Pulse 88   Temp (!) 97.3 F (36.3 C) (Oral)   Resp 14   Ht 5\' 3"  (1.6 m)   Wt 99.8 kg   LMP 01/01/2019 (Approximate)   SpO2 99%   BMI 38.97 kg/m   Fetal Assessment: FHT:  FHR: 110 bpm, variability: moderate,  accelerations:  Abscent,  decelerations:  Absent Category/reactivity:  Category I UC:   regular, every 4 minutes SVE:  3-4/70/-2 Blood show continues  Assessment / Plan: Induction of labor due to gestational hypertension with severe features   Labor: Progressing on Pitocin 75mU Preeclampsia:   Current BP 137/90 Magnesium sulfate 2g/hr No signs or symptoms of toxicity Intake and ouput balanced Labs stable Fetal Wellbeing:  Category I Pain Control:  Epidural I/D:  GBS neg, COVID neg Anticipated MOD:  unclear d/t reason for prior C/s was for failure to progress.  Pt desires TOLAC at this time. C/w Dr. Ouida Sills - will come up to see patient  Clydene Laming, CNM 09/23/2019, 5:34 PM

## 2019-09-23 NOTE — Progress Notes (Signed)
Labor Progress Note  Charlene Garcia is a 33 y.o. G2P1 at [redacted]w[redacted]d admitted for induction of labor due to gestational hypertention with severe features.  Subjective: Pt resting comfortably watching tv with FOB  Objective: BP 128/75   Pulse 87   Temp 97.7 F (36.5 C) (Oral)   Resp 14   Ht 5\' 3"  (1.6 m)   Wt 99.8 kg   LMP 01/01/2019 (Approximate)   SpO2 99%   BMI 38.97 kg/m   Fetal Assessment: FHT:  FHR: 110 bpm, variability: moderate,  accelerations:  Present,  decelerations:  Absent Category/reactivity:  Category I UC:   regular, every 4 minutes SVE:    Dilation: 2cm  Effacement: 50%  Station:  -3  Consistency: medium  Position: middle  Membrane status:Intact   Labs: Lab Results  Component Value Date   WBC 9.5 09/22/2019   HGB 12.1 09/22/2019   HCT 35.5 (L) 09/22/2019   MCV 86.8 09/22/2019   PLT 118 (L) 09/22/2019    Assessment / Plan: Induction of labor due to gestational hypertension with severe features  Pregnancy also complicated by Thrombocytopenia, Prior C/s, GDM, H/o Macrosomia, Anxiety  Laminaria removed and Pitocin initated  Labor: Progressing normally Preeclampsia:  on magnesium sulfate, no signs or symptoms of toxicity, intake and ouput balanced and labs stable Fetal Wellbeing:  Category I Pain Control:  Labor support without medications I/D:  GBS neg, COVID neg Anticipated MOD:  unclear d/t reason for prior C/s was for failure to progress.  Pt desires TOLAC at this time.  Clydene Laming, CNM 09/23/2019, 1:51 PM

## 2019-09-23 NOTE — Progress Notes (Signed)
Charlene Garcia is a 33 y.o. G2P1 at [redacted]w[redacted]d  Subjective: Worsening h/a . Pt wants to proceed with repeat ltcs and BTL   Objective: BP 137/90   Pulse 96   Temp (!) 97.3 F (36.3 C) (Oral)   Resp 14   Ht 5\' 3"  (1.6 m)   Wt 99.8 kg   LMP 01/01/2019 (Approximate)   SpO2 99%   BMI 38.97 kg/m  I/O last 3 completed shifts: In: 1541.7 [P.O.:220; I.V.:1321.7] Out: 1875 [id number] Total I/O In: 1689.3 [P.O.:236; I.V.:1453.3] Out: 1100 [Urine:1100]  FHT:  FHR: 115 bpm, variability: minimal ,  accelerations:  Abscent,  decelerations:  Absent UC:   irregular, every 4 minutes SVE:   Dilation: 4 Effacement (%): 70 Station: -2 Exam by:: Oxley CNM  Labs: Lab Results  Component Value Date   WBC 9.5 09/22/2019   HGB 12.1 09/22/2019   HCT 35.5 (L) 09/22/2019   MCV 86.8 09/22/2019   PLT 118 (L) 09/22/2019    Assessment / Plan: Worsening CNS symptoms , pt has decided to proceed with repeat LTCS abd BTL  Azithromycin and ancef for prophylaxis  Pt has been consented for the procedure - all questions answered   Gwen Her Piera Downs 09/23/2019, 5:48 PM

## 2019-09-23 NOTE — Progress Notes (Addendum)
Fetal heart baseline noted to be low with moderate variability. RN repositioned patient on her side. Baseline remains low. Patient inquired about the plan for the day. Patient has Laminaria placed. RN not aware of the Laminaria protocol. RN contacted provider to inform of a low fetal baseline despite interventions and make her aware of the patient's inquiries. Provider gave verbal order to get a magnesium level drawn on patient and that she would touch base with on call MD and come speak with the patient around lunch time about the plan for the day. RN will continue to monitor.

## 2019-09-23 NOTE — Discharge Summary (Signed)
Obstetrical Discharge Summary  Patient Name: Charlene Garcia DOB: [birth year] MRN: [id number]  Date of Admission: 09/22/2019 Date of Delivery: 09/23/19 Delivered by: Huel Cote MD Date of Discharge: 09/26/2019  Primary OB: [facility]  [id number] last menstrual period was 01/01/2019 (approximate). EDC Estimated Date of Delivery: 10/13/19 Gestational Age at Delivery: [redacted]w[redacted]d   Antepartum complications: gestational htn , previous c/s , a1 GDM , h/o fetal macrosomia, orall hsv, Admitting Diagnosis: worsening BP , severe h/a Secondary Diagnosis: Patient Active Problem List   Diagnosis Date Noted  . S/P cesarean section 09/25/2019  . Elevated blood pressure affecting pregnancy in third trimester, antepartum 09/12/2019  . Elevated BP without diagnosis of hypertension 09/05/2019  . Irregular menses 01/31/2014  . Chronic pelvic pain in female 06/30/2012  . Dyspareunia 05/14/2012  . Depression 10/27/2011    Augmentation: AROM and Pitocin Complications: worsening headache while TOLAC  Intrapartum complications/course: admitted at 37+0 weeks with HTn and severe h/a . Lab work c/w preeclampsia with severe features  Date of Delivery: 09/26/2019  Delivered By: Huel Cote MD Delivery Type: repeat cesarean section, low transverse incision Anesthesia: epidural Placenta:manual Laceration: none Episiotomy: none Newborn Data: Female born at 61 on 09/23/2019 Apgars 7/9  Weight 3300 gm    Postpartum Procedures: tachycardia, TSH elevated on PPD 2. PP Magnesium continued until 24hrs postop.    (Cesarean Section):  Patient had an uncomplicated postpartum course.  By time of discharge on POD#3, her pain was controlled on oral pain medications; she had appropriate lochia and was ambulating, voiding without difficulty, tolerating regular diet and passing flatus.   She was deemed stable for discharge to home.    Discharge Physical Exam:  BP 132/80 (BP Location: Left  Arm)   Pulse (!) 109   Temp 98.6 F (37 C) (Oral)   Resp 18   Ht 5\' 3"  (1.6 m)   Wt 99.8 kg   LMP 01/01/2019 (Approximate)   SpO2 97%   Breastfeeding Unknown   BMI 38.97 kg/m   General: NAD CV: RRR Pulm: CTABL, nl effort ABD: s/nd/nt, fundus firm and below the umbilicus Lochia: moderate Incision: c/d/i DVT Evaluation: LE non-ttp, no evidence of DVT on exam.  Hemoglobin  Date Value Ref Range Status  09/25/2019 9.2 (L) 12.0 - 15.0 g/dL Final   HCT  Date Value Ref Range Status  09/25/2019 27.2 (L) 36.0 - 46.0 % Final     Disposition: stable, discharge to home. Baby Feeding: breastmilk Baby Disposition: home with mom  Rh Immune globulin given: n/a Rubella vaccine given: n/a Tdap vaccine given in AP or PP setting: Declined in AP setting Flu vaccine given in AP or PP setting: Declined in AP setting  Contraception: BTL done at time of c/s   Prenatal Labs:  Blood type/Rh A+  Antibody screen neg  Rubella Immune  Varicella Immune  RPR NR  HBsAg Neg  HIV NR  GC neg  Chlamydia neg  Genetic screening negative  1 hour GTT 140  3 hour GTT 94, 270, 170, 87  GBS  neg    Pt needs 75 gm glucola at 6 weeks PP   Plan:  Charlene Garcia was discharged to home in good condition. Follow-up appointment with delivering provider in 6 weeks.  Discharge Medications: Allergies as of 09/26/2019      Reactions   Ortho-novum 1-35 (28) [norethin-eth Estrad Triphasic] Swelling      Medication List    TAKE these medications   acetaminophen 500 MG tablet Commonly known  as: TYLENOL Take 2 tablets (1,000 mg total) by mouth every 6 (six) hours. What changed:   when to take this  reasons to take this   EPINEPHrine 0.3 mg/0.3 mL Soaj injection Commonly known as: EPI-PEN INJECT INTO THIGH MUSCLE AS NEEDED FOR ANAPHYLAXIS.   ferrous sulfate 325 (65 FE) MG tablet Take 1 tablet (325 mg total) by mouth daily with breakfast. Start taking on: September 27, 2019    ibuprofen 600 MG tablet Commonly known as: ADVIL Take 1 tablet (600 mg total) by mouth every 6 (six) hours as needed for mild pain or cramping.   oxyCODONE 5 MG immediate release tablet Commonly known as: Oxy IR/ROXICODONE Take 1 tablet (5 mg total) by mouth every 6 (six) hours as needed for up to 7 days for moderate pain.   Prenatal 28-0.8 MG Tabs Take 1 tablet by mouth daily.   senna-docusate 8.6-50 MG tablet Commonly known as: Senokot-S Take 2 tablets by mouth daily. Start taking on: September 27, 2019   simethicone 80 MG chewable tablet Commonly known as: MYLICON Chew 1 tablet (80 mg total) by mouth 3 (three) times daily after meals.   valACYclovir 500 MG tablet Commonly known as: VALTREX Take 500 mg by mouth as needed.       Follow-up Information    Schermerhorn, Gwen Her, MD. Schedule an appointment as soon as possible for a visit in 2 week(s).   Specialty: Obstetrics and Gynecology Why: for post op appointment Contact information: [address] [place] [address] [phone]        Ward, Honor Loh, MD. Schedule an appointment as soon as possible for a visit in 3 day(s).   Specialty: Obstetrics and Gynecology Why: For BP check and TSH/T3/T4 labs Contact information: [address] [address] [phone]           Signed: Francetta Found, CNM 09/26/2019' 10:43 AM

## 2019-09-23 NOTE — Progress Notes (Addendum)
Laminaria removed by [facility].

## 2019-09-23 NOTE — Progress Notes (Signed)
Labor Progress Note  Charlene Garcia is a 33 y.o. G2P1 at [redacted]w[redacted]d admitted for induction of labor due to gestational hypertention with severe features.  Pregnancy complicated by: thrombocytopenia, Prior C/s, GDM, H/o Macrosomia, Anxiety  Subjective: Pt was asleep and reports that her headache after her nap is much better.  She is comfortable with her epidural.   Objective: BP 125/75   Pulse 99   Temp (!) 97.3 F (36.3 C) (Oral)   Resp 14   Ht 5\' 3"  (1.6 m)   Wt 99.8 kg   LMP 01/01/2019 (Approximate)   SpO2 99%   BMI 38.97 kg/m   Fetal Assessment: FHT:  FHR: 110 bpm, variability: moderate,  accelerations:  Present,  decelerations:  Absent Category/reactivity:  Category I UC:   regular, every 4 minutes SVE:   Deferred Membrane status:AROM'd clear fluid at 1245 Blood show visible when turning pt  Assessment / Plan: Induction of labor due to gestational hypertension with severe features   Labor: Progressing on Pitocin 16mU Preeclampsia:   Current BP 105/58 Magnesium sulfate 2g/hr No signs or symptoms of toxicity Intake and ouput balanced Labs stable Fetal Wellbeing:  Category I Pain Control:  Epidural I/D:  GBS neg, COVID neg Anticipated MOD:  unclear d/t reason for prior C/s was for failure to progress.  Pt desires TOLAC at this time.  Clydene Laming, CNM 09/23/2019, 4:10 PM

## 2019-09-23 NOTE — Brief Op Note (Signed)
09/23/2019  7:57 PM  PATIENT:  Michaelene Song  33 y.o. female  PRE-OPERATIVE DIAGNOSIS: preecalmpsia with severe features  Worsening headache , trial od TOLAC . Elective repeat cesarean and sterilization   POST-OPERATIVE DIAGNOSIS:  Same  As above  PROCEDURE:  Procedure(s): CESAREAN SECTION WITH BILATERAL TUBAL LIGATION LTCS  And bilateral tubal ligation  SURGEON:  Surgeon(s) and Role:    * Dayshaun Whobrey, Gwen Her, MD - Primary  PHYSICIAN ASSISTANT: Linda Hedges , CNM   ASSISTANTS: none   ANESTHESIA:   epidural  EBL:  600 mL   BLOOD ADMINISTERED:none  DRAINS: Urinary Catheter (Foley)   LOCAL MEDICATIONS USED:  MARCAINE    and BUPIVICAINE   SPECIMEN:  Source of Specimen:  portion right and left fallopian tubes   DISPOSITION OF SPECIMEN:  PATHOLOGY  COUNTS:  YES  TOURNIQUET:  * No tourniquets in log *  DICTATION: .Other Dictation: Dictation Number verbal  PLAN OF CARE: Admit to inpatient   PATIENT DISPOSITION:  PACU - hemodynamically stable.   Delay start of Pharmacological VTE agent (>24hrs) due to surgical blood loss or risk of bleeding: not applicable

## 2019-09-23 NOTE — Anesthesia Procedure Notes (Signed)
Epidural Patient location during procedure: OB Start time: 09/23/2019 1:38 PM End time: 09/23/2019 1:48 PM  Staffing Anesthesiologist: Durenda Hurt, MD Resident/CRNA: Hedda Slade, CRNA Performed: resident/CRNA   Preanesthetic Checklist Completed: patient identified, IV checked, site marked, risks and benefits discussed, surgical consent, monitors and equipment checked, pre-op evaluation and timeout performed  Epidural Patient position: sitting Prep: ChloraPrep Patient monitoring: heart rate, continuous pulse ox and blood pressure Approach: midline Location: L3-L4 Injection technique: LOR air  Needle:  Needle type: Tuohy  Needle gauge: 17 G Needle length: 9 cm and 9 Needle insertion depth: 7 cm Catheter type: closed end flexible Catheter size: 19 Gauge Catheter at skin depth: 12 cm Test dose: negative and 1.5% lidocaine with Epi 1:200 K  Assessment Events: blood not aspirated, injection not painful, no injection resistance, no paresthesia and negative IV test  Additional Notes 1 attempt Pt. Evaluated and documentation done after procedure finished. Patient identified. Risks/Benefits/Options discussed with patient including but not limited to bleeding, infection, nerve damage, paralysis, failed block, incomplete pain control, headache, blood pressure changes, nausea, vomiting, reactions to medication both or allergic, itching and postpartum back pain. Confirmed with bedside nurse the patient's most recent platelet count. Confirmed with patient that they are not currently taking any anticoagulation, have any bleeding history or any family history of bleeding disorders. Patient expressed understanding and wished to proceed. All questions were answered. Sterile technique was used throughout the entire procedure. Please see nursing notes for vital signs. Test dose was given through epidural catheter and negative prior to continuing to dose epidural or start infusion. Warning  signs of high block given to the patient including shortness of breath, tingling/numbness in hands, complete motor block, or any concerning symptoms with instructions to call for help. Patient was given instructions on fall risk and not to get out of bed. All questions and concerns addressed with instructions to call with any issues or inadequate analgesia.   Patient tolerated the insertion well without immediate complications.Reason for block:procedure for pain

## 2019-09-23 NOTE — Transfer of Care (Signed)
Immediate Anesthesia Transfer of Care Note  Patient: Charlene Garcia  Procedure(s) Performed: CESAREAN SECTION WITH BILATERAL TUBAL LIGATION (Abdomen)  Patient Location: PACU  Anesthesia Type:Epidural  Level of Consciousness: awake, alert  and oriented  Airway & Oxygen Therapy: Patient Spontanous Breathing  Post-op Assessment: Report given to RN and Post -op Vital signs reviewed and stable  Post vital signs: Reviewed and stable  Last Vitals:  Vitals Value Taken Time  BP 138/108 09/23/19 1939  Temp    Pulse    Resp    SpO2    Vitals shown include unvalidated device data.  Last Pain:  Vitals:   09/23/19 1401  TempSrc: Oral  PainSc:          Complications: No apparent anesthesia complications

## 2019-09-23 NOTE — Anesthesia Preprocedure Evaluation (Addendum)
Anesthesia Evaluation  Patient identified by MRN, date of birth, ID band Patient awake    Reviewed: Allergy & Precautions, H&P , NPO status , Patient's Chart, lab work & pertinent test results  Airway Mallampati: II  TM Distance: >3 FB Neck ROM: full    Dental no notable dental hx. (+) Teeth Intact   Pulmonary neg pulmonary ROS,    Pulmonary exam normal        Cardiovascular Normal cardiovascular exam     Neuro/Psych  Headaches (current headache, no history of migraines ), PSYCHIATRIC DISORDERS Depression    GI/Hepatic GERD  ,  Endo/Other  diabetes (diet controlled), Well Controlled  Renal/GU   negative genitourinary   Musculoskeletal   Abdominal   Peds  Hematology   Anesthesia Other Findings   Reproductive/Obstetrics (+) Pregnancy                             Anesthesia Physical Anesthesia Plan  ASA: II  Anesthesia Plan: Epidural   Post-op Pain Management:    Induction:   PONV Risk Score and Plan:   Airway Management Planned:   Additional Equipment:   Intra-op Plan:   Post-operative Plan:   Informed Consent: I have reviewed the patients History and Physical, chart, labs and discussed the procedure including the risks, benefits and alternatives for the proposed anesthesia with the patient or authorized representative who has indicated his/her understanding and acceptance.     Dental Advisory Given  Plan Discussed with: Anesthesiologist and CRNA  Anesthesia Plan Comments: (1730  Epidural to C-S)       Anesthesia Quick Evaluation

## 2019-09-23 NOTE — Progress Notes (Signed)
Charlene Garcia is a 33 y.o. G2P1 at [redacted]w[redacted]d  Records reviewed and labs reviewed . Preeclampsia by CNs symptoms . BP mild range not need HTN meds . . I have spoke with the patient about her desire for TOLAC . LAst pregnancy FTP at 4 cm with #9 baby . She feels this baby is smaller .   Subjective:  severe h/a   Currently on Pitocin 6 mu/ min  Objective: BP 128/75   Pulse 87   Temp 97.7 F (36.5 C) (Oral)   Resp 14   Ht 5\' 3"  (1.6 m)   Wt 99.8 kg   LMP 01/01/2019 (Approximate)   SpO2 99%   BMI 38.97 kg/m  I/O last 3 completed shifts: In: 1541.7 [P.O.:220; I.V.:1321.7] Out: 1875 [id number] Total I/O In: 908.8 [P.O.:236; I.V.:672.8] Out: 500 [Urine:500]  FHT:  FHR: 115 bpm, variability: moderate,  accelerations:  Present,  decelerations:  Absent UC:   irregular, every 3-4 minutes SVE:  1 cm / 50 / -3  AROM by me blood tinged  Lungs CTA   CV RRR  DTR 4+ . One beat clonus  Laminaria removed  Labs: Lab Results  Component Value Date   WBC 9.5 09/22/2019   HGB 12.1 09/22/2019   HCT 35.5 (L) 09/22/2019   MCV 86.8 09/22/2019   PLT 118 (L) 09/22/2019   Results for orders placed or performed during the hospital encounter of 09/22/19 (from the past 24 hour(s))  CBC     Status: Abnormal   Collection Time: 09/22/19  7:43 PM  Result Value Ref Range   WBC 9.5 4.0 - 10.5 K/uL   RBC 4.09 3.87 - 5.11 MIL/uL   Hemoglobin 12.1 12.0 - 15.0 g/dL   HCT 35.5 (L) 36.0 - 46.0 %   MCV 86.8 80.0 - 100.0 fL   MCH 29.6 26.0 - 34.0 pg   MCHC 34.1 30.0 - 36.0 g/dL   RDW 14.6 11.5 - 15.5 %   Platelets 118 (L) 150 - 400 K/uL   nRBC 0.0 0.0 - 0.2 %  Comprehensive metabolic panel     Status: Abnormal   Collection Time: 09/22/19  7:43 PM  Result Value Ref Range   Sodium 138 135 - 145 mmol/L   Potassium 3.9 3.5 - 5.1 mmol/L   Chloride 107 98 - 111 mmol/L   CO2 19 (L) 22 - 32 mmol/L   Glucose, Bld 116 (H) 70 - 99 mg/dL   BUN 14 6 - 20 mg/dL   Creatinine, Ser 0.80 0.44 - 1.00 mg/dL   Calcium 9.2 8.9 - 10.3 mg/dL   Total Protein 6.7 6.5 - 8.1 g/dL   Albumin 3.5 3.5 - 5.0 g/dL   AST 18 15 - 41 U/L   ALT 11 0 - 44 U/L   Alkaline Phosphatase 87 38 - 126 U/L   Total Bilirubin 0.7 0.3 - 1.2 mg/dL   GFR calc non Af Amer >60 >60 mL/min   GFR calc Af Amer >60 >60 mL/min   Anion gap 12 5 - 15  Type and screen     Status: None   Collection Time: 09/22/19  7:43 PM  Result Value Ref Range   ABO/RH(D) A POS    Antibody Screen NEG    Sample Expiration      09/25/2019,2359 Performed at [facility], [address] [place], [place] [address]   Protein / creatinine ratio, urine     Status: None   Collection Time: 09/22/19  8:45 PM  Result Value Ref Range   Creatinine, Urine 111 mg/dL   Total Protein, Urine 12 mg/dL   Protein Creatinine Ratio 0.11 0.00 - 0.15 mg/mg[Cre]  Glucose, capillary     Status: Abnormal   Collection Time: 09/23/19  8:47 AM  Result Value Ref Range   Glucose-Capillary 105 (H) 70 - 99 mg/dL   Comment 1 Notify RN   Magnesium     Status: Abnormal   Collection Time: 09/23/19  9:32 AM  Result Value Ref Range   Magnesium 5.7 (H) 1.7 - 2.4 mg/dL   Assessment / Plan: preecalmpsia with severe criteria by CNS symptoms . Clinically satble on Magnesium    Thrombocytopenia ,mild  I have spoke with he pt and her husband regarding TOLAC vs repeat C/S . She has agreed to continue with TOLAC .  I recommend an early CLE in case plt continue to drop . If she wishes to change her mild at any time regarding c/s I will re counsel .  Continue with Pitocin       [name] 09/23/2019, 1:09 PM

## 2019-09-23 NOTE — Anesthesia Post-op Follow-up Note (Signed)
Anesthesia QCDR form completed.        

## 2019-09-24 LAB — CBC
HCT: 31 % — ABNORMAL LOW (ref 36.0–46.0)
Hemoglobin: 10.5 g/dL — ABNORMAL LOW (ref 12.0–15.0)
MCH: 29.2 pg (ref 26.0–34.0)
MCHC: 33.9 g/dL (ref 30.0–36.0)
MCV: 86.1 fL (ref 80.0–100.0)
Platelets: 101 10*3/uL — ABNORMAL LOW (ref 150–400)
RBC: 3.6 MIL/uL — ABNORMAL LOW (ref 3.87–5.11)
RDW: 15.1 % (ref 11.5–15.5)
WBC: 14.8 10*3/uL — ABNORMAL HIGH (ref 4.0–10.5)
nRBC: 0 % (ref 0.0–0.2)

## 2019-09-24 LAB — GLUCOSE, CAPILLARY
Glucose-Capillary: 146 mg/dL — ABNORMAL HIGH (ref 70–99)
Glucose-Capillary: 126 mg/dL — ABNORMAL HIGH (ref 70–99)
Glucose-Capillary: 106 mg/dL — ABNORMAL HIGH (ref 70–99)

## 2019-09-24 NOTE — Progress Notes (Addendum)
Subjective: Postpartum Day 1: Cesarean Delivery with BTL Patient reports tolerating PO.    Objective: Vital signs in last 24 hours: Temp:  [96 F (35.6 C)-98.2 F (36.8 C)] 98.2 F (36.8 C) (12/12 0504) Pulse Rate:  [82-112] 97 (12/12 0710) Resp:  [15-17] 16 (12/12 0710) BP: (101-138)/(56-100) 106/65 (12/12 0710) SpO2:  [95 %-100 %] 97 % (12/12 0500)  Physical Exam:  General: alert and cooperative  Resp: Clear bilaterally CV: RRR Lochia: appropriate Uterine Fundus: firm Incision: dressing intact and dry DVT Evaluation: No evidence of DVT seen on physical exam Reflexes +2, no clonus  Recent Labs    09/22/19 1943 09/24/19 0442  HGB 12.1 10.5*  HCT 35.5* 31.0*   Urine output: 1901 - 0700  856mL = 21mL/hr  Assessment/Plan: Status post Cesarean section. Postoperative course complicated by low urine output and asymptomatic anemia  Mag Sulfate x24 hr - turn off Mag at [employer] tonight Fe supplementation Continue current care.  Jenifer E Adam Demary 09/24/2019, 8:09 AM

## 2019-09-24 NOTE — Lactation Note (Addendum)
This note was copied from a baby's chart. Lactation Consultation Note  Patient Name: Charlene Garcia [id number] Date: 09/24/2019 Reason for consult: Initial assessment;Early term 37-38.6wks;Other (Comment)(Mom still in birthplace on Magnesium Sulfate)  When went in room, Luisa Hart was demonstrating feeding cues which were pointed out to mom.  Mom still in [place] on [place].  Positioned Gavin in football hold on left breast and observed excellent breast feed with strong, rhythmic sucking and occasional swallows.  Demonstrated hand expression on right breast and mom could easily hand express colostrum.  Rubbed colostrum on nipples to prevent bacteria, lubricate, help with healing and to prevent nipple discomfort.  Mom denies any breast or nipple discomfort presently..  Mom reports trying to breast feed first baby who is 35 years old now, but was young and gave up early.  Mom reports doing a lot of reading up on breast feeding with Luisa Hart and wants to breast feed exclusively for 2 to 4 months since she is not working now.  After that time she may pump some so FOB can give some bottles of breast milk.  Reviewed newborn stomach size, adequate intake and out put, supply and demand, normal course of lactation and routine newborn feeding patterns.  Encouraged mom to call lactation with any questions, concerns or assistance.  .      Maternal Data Formula Feeding for Exclusion: No Has patient been taught Hand Expression?: Yes Does the patient have breastfeeding experience prior to this delivery?: Yes  Feeding Feeding Type: Breast Fed  LATCH Score Latch: Grasps breast easily, tongue down, lips flanged, rhythmical sucking.  Audible Swallowing: A few with stimulation  Type of Nipple: Everted at rest and after stimulation(Fairly Large nipple)  Comfort (Breast/Nipple): Soft / non-tender  Hold (Positioning): No assistance needed to correctly position infant at breast.  LATCH Score:  9  Interventions Interventions: Breast feeding basics reviewed;Breast massage;Hand express;Breast compression;Support pillows;Position options  Lactation Tools Discussed/Used WIC Program: Yes   Consult Status Consult Status: Follow-up Date: 09/24/19 Follow-up type: Call as needed    Jarold Motto 09/24/2019, 3:21 PM

## 2019-09-24 NOTE — Progress Notes (Signed)
Postpartum Daily Progress Note   33 y.o. [id number] s/p C-Section, Vacuum Assisted  POD#: 1  Pt doing well. Getting rest.  Objective Vitals: Temp:  [96 F (35.6 C)-98.2 F (36.8 C)] 98.2 F (36.8 C) (12/12 1104) Pulse Rate:  [82-112] 101 (12/12 1504) Resp:  [15-18] 18 (12/12 1504) BP: (101-138)/(56-100) 110/70 (12/12 1504) SpO2:  [91 %-100 %] 91 % (12/12 1505)  Data: Urine Output 0700 30 mL 0800 50 mL 0900 50 mL 1000 65 mL 1100 95 mL 1200 165 mL 1300 125 mL 1400 135 mL  A/P:  Active Problems Low urine output - improving Mag Sulfate - plan to d/c @ [place], CNM 09/24/2019 3:17 PM

## 2019-09-24 NOTE — Op Note (Signed)
NAME: Charlene Garcia, Charlene Garcia MEDICAL RECORD [id number] ACCOUNT [id number] DATE OF BIRTH:12/04/1985 FACILITY: [facility] LOCATION: [facility] PHYSICIAN:Killian Schwer Josefine Class, MD  OPERATIVE REPORT  DATE OF PROCEDURE:  09/23/2019  PREOPERATIVE DIAGNOSES: 1.  A 32+1 weeks estimated gestational age. 2.  Preeclampsia with severe features. 3.  Prior cesarean section. 4.  Elective permanent sterilization.  POSTOPERATIVE DIAGNOSES: 1.  Failed trial of labor after cesarean section. 2.  Preeclampsia with severe features. 3.  Viable female delivered. 4.  Elective permanent sterilization.  PROCEDURE:   1.  Repeat low transverse cesarean section. 2.  Bilateral tubal ligation -- [place].  ANESTHESIA:  Surgical dosing of continuous lumbar epidural.  SURGEON:  Boykin Nearing, MD  FIRST ASSISTANT:  Linda Hedges, certified nurse midwife.  INDICATIONS:  A 33 year old gravida 2, para 1 patient at 32+1 weeks estimated gestational age, was admitted to labor and delivery the day prior with elevated blood pressures and severe headache.  Laboratory studies confirmed preeclampsia with severe  features.  The patient was started on magnesium and desired a trial of labor after cesarean section.  The patient had previously decided on elective permanent sterilization and confirms this again the day of the procedure.  Given several hours of labor,  the patient's headache worsened.  The patient decided she wished to undergo a repeat cesarean section and sterilization.  DESCRIPTION OF PROCEDURE:  After adequate surgical dosing of continuous lumbar epidural, the patient was placed in dorsal supine position with the right hip roll on the right side.  The patient's abdomen was prepped and draped in normal sterile fashion.   The patient did receive 2 grams IV Ancef and 500 mg azithromycin for surgical prophylaxis.  Timeout was performed.  A Pfannenstiel incision was made 2 fingerbreadths above the symphysis  pubis.  Sharp dissection was used to identify the fascia.  Fascia  was opened in the midline and opened in a transverse fashion.  The superior aspect of the fascia was grasped with Kocher clamps and the recti muscles were dissected free.  Inferior aspect of the fascia was grasped with Kocher clamps and pyramidalis  muscles dissected free.  Entry into the peritoneal cavity was accomplished sharply.  The vesicouterine peritoneal fold was identified and opened and the bladder was reflected inferiorly.  A low transverse uterine incision was made.  Upon entry into the  endometrial cavity, clear fluid resulted. Fetal head was brought to the incision and a Kiwi vacuum was applied to the fetal occiput.  One pop off was noted.  Reapplication of the Kiwi was performed and the delivery of the head was then accomplished.   Shoulders and body were delivered without difficulty.  A female infant was dried on the abdominal cavity and was slightly floppy initially, so at 45-second post-delivery, the cord was doubly clamped and the infant was passed to nursery staff who assigned  Apgar scores of 7 and 9.  Fetal weight 3300 grams, female infant.  Time of birth 42 on 09/23/2019.  Placenta was manually delivered and the uterus was exteriorized.  The endometrial cavity was wiped clean with laparotomy tape.  Membranes were somewhat  adherent to the fundal area and ultimately came out intact.  Uterus was atonic and IV Pitocin was administered and 250 mcg of Hemabate was administered intramuscular.  The uterine incision was closed with #1 chromic suture in a running locking fashion.   Several additional figure-of-eight sutures were required for hemostasis.  Attention was directed to the patient's right fallopian tube, which was grasped at  the midportion of the fallopian tube.  Two separate 0 plain gut sutures were applied and a 1.5 cm  portion of fallopian tube was removed.  Similar procedure was repeated on the patient's left  fallopian tube, again grasping the midportion.  Two separate 0 plain gut sutures were applied.  Ovaries appeared normal.  Posterior cul-de-sac was irrigated and  suctioned.  The uterus was placed back into the abdominal cavity and the pericolic gutters were wiped clean with laparotomy tape.  Tubal ligation sites appeared hemostatic.  Uterine incision again appeared hemostatic.  Interceed was placed over the  uterine incision in a T-shaped fashion.  Fascia was then closed with an 0 Vicryl suture running nonlocking fashion.  Two separate sutures were used to accomplish closure.  The fascial edges were then were injected with a selection with a solution  consisting of 20 mL of 1.3% Exparel plus 30 mL of 0.5% Marcaine and 50 mL normal saline;  60 mL solution was injected subfascially.  Subcutaneous tissues were then irrigated and bovied for hemostasis.  Given the depth of the subcutaneous tissue  approximately 3.5 cm the dead space was closed with a running 2-0 chromic suture.  The incision was closed with Insorb absorbable staples with good cosmetic effect.  Additional 30 mL of Exparel solution was injected beneath the skin.  COMPLICATIONS:  There were no complications.  ESTIMATED BLOOD LOSS:  600 mL.  INTRAOPERATIVE FLUIDS:  1500 mL.  DISPOSITION:  The patient was taken to recovery room in good condition.  PN/NUANCE  D:09/23/2019 T:09/24/2019 JOB:[id number]

## 2019-09-24 NOTE — Plan of Care (Signed)

## 2019-09-25 DIAGNOSIS — Z98891 History of uterine scar from previous surgery: Secondary | ICD-10-CM

## 2019-09-25 LAB — CBC WITH DIFFERENTIAL/PLATELET
Abs Immature Granulocytes: 0.22 10*3/uL — ABNORMAL HIGH (ref 0.00–0.07)
Basophils Absolute: 0 10*3/uL (ref 0.0–0.1)
Basophils Relative: 0 %
Eosinophils Absolute: 0 10*3/uL (ref 0.0–0.5)
Eosinophils Relative: 0 %
HCT: 27.2 % — ABNORMAL LOW (ref 36.0–46.0)
Hemoglobin: 9.2 g/dL — ABNORMAL LOW (ref 12.0–15.0)
Immature Granulocytes: 1 %
Lymphocytes Relative: 11 %
Lymphs Abs: 1.8 10*3/uL (ref 0.7–4.0)
MCH: 29.2 pg (ref 26.0–34.0)
MCHC: 33.8 g/dL (ref 30.0–36.0)
MCV: 86.3 fL (ref 80.0–100.0)
Monocytes Absolute: 0.8 10*3/uL (ref 0.1–1.0)
Monocytes Relative: 5 %
Neutro Abs: 14 10*3/uL — ABNORMAL HIGH (ref 1.7–7.7)
Neutrophils Relative %: 83 %
Platelets: 147 10*3/uL — ABNORMAL LOW (ref 150–400)
RBC: 3.15 MIL/uL — ABNORMAL LOW (ref 3.87–5.11)
RDW: 16 % — ABNORMAL HIGH (ref 11.5–15.5)
WBC: 16.8 10*3/uL — ABNORMAL HIGH (ref 4.0–10.5)
nRBC: 0 % (ref 0.0–0.2)

## 2019-09-25 LAB — COMPREHENSIVE METABOLIC PANEL
ALT: 12 U/L (ref 0–44)
AST: 20 U/L (ref 15–41)
Albumin: 2.8 g/dL — ABNORMAL LOW (ref 3.5–5.0)
Alkaline Phosphatase: 68 U/L (ref 38–126)
Anion gap: 7 (ref 5–15)
BUN: 14 mg/dL (ref 6–20)
CO2: 22 mmol/L (ref 22–32)
Calcium: 7.9 mg/dL — ABNORMAL LOW (ref 8.9–10.3)
Chloride: 107 mmol/L (ref 98–111)
Creatinine, Ser: 0.72 mg/dL (ref 0.44–1.00)
GFR calc Af Amer: >60 mL/min (ref >60)
GFR calc non Af Amer: >60 mL/min (ref >60)
Glucose, Bld: 81 mg/dL (ref 70–99)
Potassium: 4.5 mmol/L (ref 3.5–5.1)
Sodium: 136 mmol/L (ref 135–145)
Total Bilirubin: 0.6 mg/dL (ref 0.3–1.2)
Total Protein: 6.2 g/dL — ABNORMAL LOW (ref 6.5–8.1)

## 2019-09-25 LAB — TSH: TSH: 5.606 u[IU]/mL — ABNORMAL HIGH (ref 0.350–4.500)

## 2019-09-25 MED ORDER — FERROUS SULFATE 325 (65 FE) MG PO TABS
325.0000 mg | ORAL_TABLET | Freq: Every day | ORAL | Status: DC
  Administered 2019-09-26: 325 mg via ORAL
  Filled 2019-09-25: qty 1

## 2019-09-25 MED ORDER — IBUPROFEN 600 MG PO TABS
600.0000 mg | ORAL_TABLET | Freq: Four times a day (QID) | ORAL | Status: DC | PRN
  Administered 2019-09-25 – 2019-09-26 (×2): 600 mg via ORAL
  Filled 2019-09-25 (×2): qty 1

## 2019-09-25 NOTE — Progress Notes (Signed)
Postpartum Daily Progress Note   33 y.o. [id number] s/p C-Section, Vacuum Assisted  for GHTN with severe features  POD#: 2  At [facility] for persistent tachycardia.  Pt denies palpitations, anxiety, lightheadedness, HA, or SOB.  She reports feeling fine with previous gas pains improving.  Objective Vitals: Temp:  [98.2 F (36.8 C)-99.6 F (37.6 C)] 98.3 F (36.8 C) (12/13 1524) Pulse Rate:  [100-118] 115 (12/13 1524) Resp:  [18-20] 18 (12/13 1524) BP: (104-146)/(65-93) 121/81 (12/13 1524) SpO2:  [91 %-98 %] 98 % (12/13 1524)  Vitals with BMI 09/25/2019 09/25/2019 09/25/2019  Height - - -  Weight - - -  BMI - - -  Systolic [id number]  Diastolic 81 93 90  Pulse [id number] 113    Physical Exam: C-section: Respiratory: normal effort Cardiovascular: RRR  Results for orders placed or performed during the hospital encounter of 09/22/19 (from the past 48 hour(s))  Glucose, capillary     Status: Abnormal   Collection Time: 09/23/19  9:04 PM  Result Value Ref Range   Glucose-Capillary 132 (H) 70 - 99 mg/dL  Glucose, capillary     Status: Abnormal   Collection Time: 09/24/19  1:03 AM  Result Value Ref Range   Glucose-Capillary 146 (H) 70 - 99 mg/dL  CBC     Status: Abnormal   Collection Time: 09/24/19  4:42 AM  Result Value Ref Range   WBC 14.8 (H) 4.0 - 10.5 K/uL   RBC 3.60 (L) 3.87 - 5.11 MIL/uL   Hemoglobin 10.5 (L) 12.0 - 15.0 g/dL   HCT 31.0 (L) 36.0 - 46.0 %   MCV 86.1 80.0 - 100.0 fL   MCH 29.2 26.0 - 34.0 pg   MCHC 33.9 30.0 - 36.0 g/dL   RDW 15.1 11.5 - 15.5 %   Platelets 101 (L) 150 - 400 K/uL    Comment: Immature Platelet Fraction may be clinically indicated, consider ordering this additional test [id number]    nRBC 0.0 0.0 - 0.2 %    Comment: Performed at [facility], [address] [place], [place] [address]  Glucose, capillary     Status: Abnormal   Collection Time: 09/24/19  5:20 AM  Result Value Ref Range   Glucose-Capillary 126 (H) 70 - 99 mg/dL   Glucose, capillary     Status: Abnormal   Collection Time: 09/24/19  8:27 AM  Result Value Ref Range   Glucose-Capillary 106 (H) 70 - 99 mg/dL    A/P:  Active Problems 1. Tachycardia - C/w Dr. Leonides Schanz - Will order a TSH, CBC and CMP  2. Acute blood loss anemia - Asymptomatic - Redraw CBC - Fe supplementation  Charlene Garcia, CNM 09/25/2019 6:01 PM

## 2019-09-25 NOTE — Progress Notes (Signed)
Subjective: Postpartum Day 2: Repeat Cesarean Delivery for GHTN with severe features Patient reports tolerating PO, + flatus and no problems voiding.  Pt reports a lot of cramping which is keeping her from getting up as much as she should.  Objective: Vital signs in last 24 hours: Temp:  [98.2 F (36.8 C)-99.6 F (37.6 C)] 98.5 F (36.9 C) (12/13 0810) Pulse Rate:  [96-118] 113 (12/13 0810) Resp:  [16-20] 20 (12/13 0810) BP: (104-141)/(65-90) 141/90 (12/13 0810) SpO2:  [90 %-97 %] 94 % (12/13 0810)  Physical Exam:  General: alert and cooperative Lochia: appropriate Uterine Fundus: firm Incision: dressing intact and dry DVT Evaluation: No evidence of DVT seen on physical exam.  Recent Labs    09/22/19 1943 09/24/19 0442  HGB 12.1 10.5*  HCT 35.5* 31.0*    Assessment/Plan: Status post Cesarean section. Postoperative course complicated by pain, specifically cramping  Gas pain as well Encouraged her to use Roxicodone if needed Continue to ambulate when medication is on board and pain is reduced.  Plan for discharge tomorrow  Clydene Laming 09/25/2019, 10:41 AM

## 2019-09-25 NOTE — Anesthesia Postprocedure Evaluation (Signed)
Anesthesia Post Note  Patient: Charlene Garcia  Procedure(s) Performed: CESAREAN SECTION WITH BILATERAL TUBAL LIGATION (Abdomen)  Patient location during evaluation: Mother Baby Anesthesia Type: Epidural Level of consciousness: awake and alert Pain management: pain level controlled Vital Signs Assessment: post-procedure vital signs reviewed and stable Respiratory status: spontaneous breathing, nonlabored ventilation and respiratory function stable Cardiovascular status: stable Postop Assessment: no headache, no backache and epidural receding Anesthetic complications: no     Last Vitals:  Vitals:   09/25/19 0300 09/25/19 0810  BP: 128/83 (!) 141/90  Pulse: (!) 118 (!) 113  Resp:  20  Temp: 37.6 C 36.9 C  SpO2: 95% 94%    Last Pain:  Vitals:   09/25/19 0810  TempSrc: Oral  PainSc:                  Alphonsus Sias

## 2019-09-25 NOTE — Lactation Note (Signed)
This note was copied from a baby's chart. Lactation Consultation Note  Patient Name: Charlene Garcia [id number] Date: 09/25/2019   Mom called for lactation assistance b/c she could not get Charlene Garcia to latch to right breast.  Charlene Garcia was sleepy and not interested.  Took outfit off and placed him skin to skin with mom in football hold.  After several attempts he finally latched, but only took a couple of sucks.  FOB suggested putting him on the left breast which is the breast he liked the best.  As soon as we placed him on the left breast in football hold, he latched and immediately began strong rhythmic sucking with spontaneous audible swallows sustaining the latch and consistently sucking without stimulation for 30 minutes.  Mom reports Charlene Garcia breast feeds well on right breast as well, but does seem to prefer left breast.  Reminded mom to watch for first feeding cues and try to latch him to right breast first.  Lactation name and number written on white board and encouraged to call with any questions, concerns or assistance.  Maternal Data    Feeding    LATCH Score                   Interventions    Lactation Tools Discussed/Used     Consult Status      Jarold Motto 09/25/2019, 9:45 PM

## 2019-09-26 LAB — GLUCOSE, CAPILLARY: Glucose-Capillary: 97 mg/dL (ref 70–99)

## 2019-09-26 MED ORDER — SENNOSIDES-DOCUSATE SODIUM 8.6-50 MG PO TABS: 2.0000 | ORAL_TABLET | ORAL | 0 refills | Status: AC

## 2019-09-26 MED ORDER — SIMETHICONE 80 MG PO CHEW: 80.0000 mg | CHEWABLE_TABLET | Freq: Three times a day (TID) | ORAL | 0 refills | Status: AC

## 2019-09-26 MED ORDER — ACETAMINOPHEN 500 MG PO TABS: 1000.0000 mg | ORAL_TABLET | Freq: Four times a day (QID) | ORAL | 0 refills | Status: AC

## 2019-09-26 MED ORDER — OXYCODONE HCL 5 MG PO TABS: 5.0000 mg | ORAL_TABLET | Freq: Four times a day (QID) | ORAL | 0 refills | Status: AC | PRN

## 2019-09-26 MED ORDER — IBUPROFEN 600 MG PO TABS: 600.0000 mg | ORAL_TABLET | Freq: Four times a day (QID) | ORAL | 0 refills | Status: AC | PRN

## 2019-09-26 MED ORDER — FERROUS SULFATE 325 (65 FE) MG PO TABS: 325.0000 mg | ORAL_TABLET | Freq: Every day | ORAL | 1 refills | Status: AC

## 2019-09-26 NOTE — Discharge Instructions (Signed)
Postpartum Hypertension Postpartum hypertension is high blood pressure that remains higher than normal after childbirth. You may not realize that you have postpartum hypertension if your blood pressure is not being checked regularly. In most cases, postpartum hypertension will go away on its own, usually within a week of delivery. However, for some women, medical treatment is required to prevent serious complications, such as seizures or stroke. What are the causes? This condition may be caused by one or more of the following:  Hypertension that existed before pregnancy (chronic hypertension).  Hypertension that comes on as a result of pregnancy (gestational hypertension).  Hypertensive disorders during pregnancy (preeclampsia) or seizures in women who have high blood pressure during pregnancy (eclampsia).  A condition in which the liver, platelets, and red blood cells are damaged during pregnancy (HELLP syndrome).  A condition in which the thyroid produces too much hormones (hyperthyroidism).  Other rare problems of the nerves (neurological disorders) or blood disorders. In some cases, the cause may not be known. What increases the risk? The following factors may make you more likely to develop this condition:  Chronic hypertension. In some cases, this may not have been diagnosed before pregnancy.  Obesity.  Type 2 diabetes.  Kidney disease.  History of preeclampsia or eclampsia.  Other medical conditions that change the level of hormones in the body (hormonal imbalance). What are the signs or symptoms? As with all types of hypertension, postpartum hypertension may not have any symptoms. Depending on how high your blood pressure is, you may experience:  Headaches. These may be mild, moderate, or severe. They may also be steady, constant, or sudden in onset (thunderclap headache).  Changes in your ability to see (visual changes).  Dizziness.  Shortness of breath.  Swelling  of your hands, feet, lower legs, or face. In some cases, you may have swelling in more than one of these locations.  Heart palpitations or a racing heartbeat.  Difficulty breathing while lying down.  Decrease in the amount of urine that you pass. Other rare signs and symptoms may include:  Sweating more than usual. This lasts longer than a few days after delivery.  Chest pain.  Sudden dizziness when you get up from sitting or lying down.  Seizures.  Nausea or vomiting.  Abdominal pain. How is this diagnosed? This condition may be diagnosed based on the results of a physical exam, blood pressure measurements, and blood and urine tests. You may also have other tests, such as a CT scan or an MRI, to check for other problems of postpartum hypertension. How is this treated? If blood pressure is high enough to require treatment, your options may include:  Medicines to reduce blood pressure (antihypertensives). Tell your health care provider if you are breastfeeding or if you plan to breastfeed. There are many antihypertensive medicines that are safe to take while breastfeeding.  Stopping medicines that may be causing hypertension.  Treating medical conditions that are causing hypertension.  Treating the complications of hypertension, such as seizures, stroke, or kidney problems. Your health care provider will also continue to monitor your blood pressure closely until it is within a safe range for you. Follow these instructions at home:  Take over-the-counter and prescription medicines only as told by your health care provider.  Return to your normal activities as told by your health care provider. Ask your health care provider what activities are safe for you.  Do not use any products that contain nicotine or tobacco, such as cigarettes and e-cigarettes. If   you need help quitting, ask your health care provider.  Keep all follow-up visits as told by your health care provider. This  is important. Contact a health care provider if:  Your symptoms get worse.  You have new symptoms, such as: ? A headache that does not get better. ? Dizziness. ? Visual changes. Get help right away if:  You suddenly develop swelling in your hands, ankles, or face.  You have sudden, rapid weight gain.  You develop difficulty breathing, chest pain, racing heartbeat, or heart palpitations.  You develop severe pain in your abdomen.  You have any symptoms of a stroke. "BE FAST" is an easy way to remember the main warning signs of a stroke: ? B - Balance. Signs are dizziness, sudden trouble walking, or loss of balance. ? E - Eyes. Signs are trouble seeing or a sudden change in vision. ? F - Face. Signs are sudden weakness or numbness of the face, or the face or eyelid drooping on one side. ? A - Arms. Signs are weakness or numbness in an arm. This happens suddenly and usually on one side of the body. ? S - Speech. Signs are sudden trouble speaking, slurred speech, or trouble understanding what people say. ? T - Time. Time to call emergency services. Write down what time symptoms started.  You have other signs of a stroke, such as: ? A sudden, severe headache with no known cause. ? Nausea or vomiting. ? Seizure. These symptoms may represent a serious problem that is an emergency. Do not wait to see if the symptoms will go away. Get medical help right away. Call your local emergency services (911 in the [place] Do not drive yourself to the hospital. Summary  Postpartum hypertension is high blood pressure that remains higher than normal after childbirth.  In most cases, postpartum hypertension will go away on its own, usually within a week of delivery.  For some women, medical treatment is required to prevent serious complications, such as seizures or stroke. This information is not intended to replace advice given to you by your health care provider. Make sure you discuss any questions  you have with your health care provider. Document Released: 06/02/2014 Document Revised: 11/05/2018 Document Reviewed: 07/20/2017 Elsevier Patient Education  2020 [employer]. Postpartum Care After Cesarean Delivery This sheet gives you information about how to care for yourself from the time you deliver your baby to up to 6-12 weeks after delivery (postpartum period). Your health care provider may also give you more specific instructions. If you have problems or questions, contact your health care provider. Follow these instructions at home: Medicines  Take over-the-counter and prescription medicines only as told by your health care provider.  If you were prescribed an antibiotic medicine, take it as told by your health care provider. Do not stop taking the antibiotic even if you start to feel better.  Ask your health care provider if the medicine prescribed to you: ? Requires you to avoid driving or using heavy machinery. ? Can cause constipation. You may need to take actions to prevent or treat constipation, such as:  Drink enough fluid to keep your urine pale yellow.  Take over-the-counter or prescription medicines.  Eat foods that are high in fiber, such as beans, whole grains, and fresh fruits and vegetables.  Limit foods that are high in fat and processed sugars, such as fried or sweet foods. Activity  Gradually return to your normal activities as told by your health care provider.  Avoid activities that take a lot of effort and energy (are strenuous) until approved by your health care provider. Walking at a slow to moderate pace is usually safe. Ask your health care provider what activities are safe for you. ? Do not lift anything that is heavier than your baby or 10 lb (4.5 kg) as told by your health care provider. ? Do not vacuum, climb stairs, or drive a car for as long as told by your health care provider.  If possible, have someone help you at home until you are able to  do your usual activities yourself.  Rest as much as possible. Try to rest or take naps while your baby is sleeping. Vaginal bleeding  It is normal to have vaginal bleeding (lochia) after delivery. Wear a sanitary pad to absorb vaginal bleeding and discharge. ? During the first week after delivery, the amount and appearance of lochia is often similar to a menstrual period. ? Over the next few weeks, it will gradually decrease to a dry, yellow-brown discharge. ? For most women, lochia stops completely by 4-6 weeks after delivery. Vaginal bleeding can vary from woman to woman.  Change your sanitary pads frequently. Watch for any changes in your flow, such as: ? A sudden increase in volume. ? A change in color. ? Large blood clots.  If you pass a blood clot, save it and call your health care provider to discuss. Do not flush blood clots down the toilet before you get instructions from your health care provider.  Do not use tampons or douches until your health care provider says this is safe.  If you are not breastfeeding, your period should return 6-8 weeks after delivery. If you are breastfeeding, your period may return anytime between 8 weeks after delivery and the time that you stop breastfeeding. Perineal care   If your C-section (Cesarean section) was unplanned, and you were allowed to labor and push before delivery, you may have pain, swelling, and discomfort of the tissue between your vaginal opening and your anus (perineum). You may also have an incision in the tissue (episiotomy) or the tissue may have torn during delivery. Follow these instructions as told by your health care provider: ? Keep your perineum clean and dry as told by your health care provider. Use medicated pads and pain-relieving sprays and creams as directed. ? If you have an episiotomy or vaginal tear, check the area every day for signs of infection. Check for:  Redness, swelling, or pain.  Fluid or  blood.  Warmth.  Pus or a bad smell. ? You may be given a squirt bottle to use instead of wiping to clean the perineum area after you go to the bathroom. As you start healing, you may use the squirt bottle before wiping yourself. Make sure to wipe gently. ? To relieve pain caused by an episiotomy, vaginal tear, or hemorrhoids, try taking a warm sitz bath 2-3 times a day. A sitz bath is a warm water bath that is taken while you are sitting down. The water should only come up to your hips and should cover your buttocks. Breast care  Within the first few days after delivery, your breasts may feel heavy, full, and uncomfortable (breast engorgement). You may also have milk leaking from your breasts. Your health care provider can suggest ways to help relieve breast discomfort. Breast engorgement should go away within a few days.  If you are breastfeeding: ? Wear a bra that supports your breasts and  fits you well. ? Keep your nipples clean and dry. Apply creams and ointments as told by your health care provider. ? You may need to use breast pads to absorb milk leakage. ? You may have uterine contractions every time you breastfeed for several weeks after delivery. Uterine contractions help your uterus return to its normal size. ? If you have any problems with breastfeeding, work with your health care provider or a Science writer.  If you are not breastfeeding: ? Avoid touching your breasts as this can make your breasts produce more milk. ? Wear a well-fitting bra and use cold packs to help with swelling. ? Do not squeeze out (express) milk. This causes you to make more milk. Intimacy and sexuality  Ask your health care provider when you can engage in sexual activity. This may depend on your: ? Risk of infection. ? Healing rate. ? Comfort and desire to engage in sexual activity.  You are able to get pregnant after delivery, even if you have not had your period. If desired, talk with your  health care provider about methods of family planning or birth control (contraception). Lifestyle  Do not use any products that contain nicotine or tobacco, such as cigarettes, e-cigarettes, and chewing tobacco. If you need help quitting, ask your health care provider.  Do not drink alcohol, especially if you are breastfeeding. Eating and drinking   Drink enough fluid to keep your urine pale yellow.  Eat high-fiber foods every day. These may help prevent or relieve constipation. High-fiber foods include: ? Whole grain cereals and breads. ? Brown rice. ? Beans. ? Fresh fruits and vegetables.  Take your prenatal vitamins until your postpartum checkup or until your health care provider tells you it is okay to stop. General instructions  Keep all follow-up visits for you and your baby as told by your health care provider. Most women visit their health care provider for a postpartum checkup within the first 3-6 weeks after delivery. Contact a health care provider if you:  Feel unable to cope with the changes that a new baby brings to your life, and these feelings do not go away.  Feel unusually sad or worried.  Have breasts that are painful, hard, or turn red.  Have a fever.  Have trouble holding urine or keeping urine from leaking.  Have little or no interest in activities you used to enjoy.  Have not breastfed at all and you have not had a menstrual period for 12 weeks after delivery.  Have stopped breastfeeding and you have not had a menstrual period for 12 weeks after you stopped breastfeeding.  Have questions about caring for yourself or your baby.  Pass a blood clot from your vagina. Get help right away if you:  Have chest pain.  Have difficulty breathing.  Have sudden, severe leg pain.  Have severe pain or cramping in your abdomen.  Bleed from your vagina so much that you fill more than one sanitary pad in one hour. Bleeding should not be heavier than your  heaviest period.  Develop a severe headache.  Faint.  Have blurred vision or spots in your vision.  Have a bad-smelling vaginal discharge.  Have thoughts about hurting yourself or your baby. If you ever feel like you may hurt yourself or others, or have thoughts about taking your own life, get help right away. You can go to your nearest emergency department or call:  Your local emergency services (911 in the [place]  A suicide  crisis helpline, such as the [facility] at 872-241-1113. This is open 24 hours a day. Summary  The period of time from when you deliver your baby to up to 6-12 weeks after delivery is called the postpartum period.  Gradually return to your normal activities as told by your health care provider.  Keep all follow-up visits for you and your baby as told by your health care provider. This information is not intended to replace advice given to you by your health care provider. Make sure you discuss any questions you have with your health care provider. Document Released: 09/26/2000 Document Revised: 05/19/2018 Document Reviewed: 05/19/2018 Elsevier Patient Education  2020 [employer]. Breastfeeding Tips for a Good Latch Latching is how your baby's mouth attaches to your nipple to breastfeed. It is an important part of breastfeeding. Your baby may have trouble latching for a number of reasons. A poor latch may cause you to have cracked or sore nipples or other problems. Follow these instructions at home: How to position your baby  Find a comfortable place to sit or lie down. Your neck and back should be well supported.  If you are seated, place a pillow or rolled-up blanket under your baby. This will bring him or her to the level of your breast.  Make sure that your baby's belly (abdomen) is facing your belly.  Try different positions to find one that works best for you and your baby. How to help your baby latch   To start, gently  rub your breast. Move your fingertips in a circle as you massage from your chest wall toward your nipple. This helps milk flow. Keep doing this during feeding if needed.  Position your breast. Hold your breast with four fingers underneath and your thumb above your nipple. Keep your fingers away from your nipple and your baby's mouth. Follow these steps to help your baby latch: 1. Rub your baby's lips gently with your finger or nipple. 2. When your baby's mouth is open wide enough, quickly bring your baby to your breast and place your whole nipple into your baby's mouth. Place as much of the colored area around your nipple (areola)as possible into your baby's mouth. 3. Your baby's tongue should be between his or her lower gum and your breast. 4. You should be able to see more areola above your baby's upper lip than below the lower lip. 5. When your baby starts sucking, you will feel a gentle pull on your nipple. You should not feel any pain. Be patient. It is common for a baby to suck for about 2-3 minutes to start the flow of breast milk. 6. Make sure that your baby's mouth is in the right position around your nipple. Your baby's lips should make a seal on your breast and be turned outward.  General instructions  Look for these signs that your baby has latched on to your nipple: ? The baby is quietly tugging or sucking without causing you pain. ? You hear the baby swallow after every 3 or 4 sucks. ? You see movement above and in front of the baby's ears while he or she is sucking.  Be aware of these signs that your baby has not latched on to your nipple: ? The baby makes sucking sounds or smacking sounds while feeding. ? You have nipple pain.  If your baby is not latched well, put your little finger between your baby's gums and your nipple. This will break the seal.  Then try to help your baby latch again.  If you keep having problems, get help from a breastfeeding specialist (Environmental consultant). Contact a doctor if:  You have cracking or soreness in your nipples that lasts longer than 1 week.  You have nipple pain.  Your breasts are filled with too much milk (engorgement), and this does not improve after 48-72 hours.  You have a plugged milk duct and a fever.  You follow the tips for a good latch but you keep having problems or concerns.  You have a pus-like fluid coming from your breast.  Your baby is not gaining weight.  Your baby loses weight. Summary  Latching is how your baby's mouth attaches to your nipple to breastfeed.  Try different positions for breastfeeding to find one that works best for you and your baby.  A poor latch may cause you to have cracked or sore nipples or other problems. This information is not intended to replace advice given to you by your health care provider. Make sure you discuss any questions you have with your health care provider. Document Released: 05/06/2017 Document Revised: 01/19/2019 Document Reviewed: 05/06/2017 Elsevier Patient Education  2020 [employer].

## 2019-09-26 NOTE — Progress Notes (Signed)
Post Partum Day 3 Subjective: Doing well, no complaints.  Tolerating regular diet, pain with PO meds, voiding and ambulating without difficulty.  No CP SOB Fever,Chills, N/V or leg pain; denies nipple or breast pain; no HA change of vision, RUQ/epigastric pain.  Objective: BP 132/80 (BP Location: Left Arm)   Pulse (!) 109   Temp 98.6 F (37 C) (Oral)   Resp 18   Ht 5\' 3"  (1.6 m)   Wt 99.8 kg   LMP 01/01/2019 (Approximate)   SpO2 97%   Breastfeeding Unknown   BMI 38.97 kg/m  BPs reviewed: 130-140/80-100. No severe range Bps noted.    Physical Exam:  General: NAD Breasts: soft/nontender CV: RRR Pulm: nl effort, CTABL Abdomen: soft, NT, BS x 4 Incision: no erythema or drainage Lochia: small Uterine Fundus: fundus firm and 1 fb below umbilicus DVT Evaluation: no cords, ttp LEs   Recent Labs    09/24/19 0442 09/25/19 1819  HGB 10.5* 9.2*  HCT 31.0* 27.2*  WBC 14.8* 16.8*  PLT 101* 147*    Assessment/Plan: 33 y.o. G2P1002 postpartum day # 3  - Continue routine PP care: reviewed VS, pulse remains 100-115 with normal to mild range BPs. Stable for DC home, with plan to f/u end of week for BP check and repeat labs including thyroid panel.  - Lactation consult prn.  - Discussed contraceptive options including implant, IUDs hormonal and non-hormonal, injection, pills/ring/patch, condoms, and NFP.  - Acute blood loss anemia - hemodynamically stable and asymptomatic; start po ferrous sulfate BID with stool softeners  - Immunization status: offer Tdap and flu.     Disposition: Does desire Dc home today.     Francetta Found, CNM 09/26/2019  10:39 AM

## 2019-09-26 NOTE — Progress Notes (Signed)
Pt discharged with infant.  Discharge instructions, prescriptions and follow up appointment given to and reviewed with pt. Pt verbalized understanding. Escorted out by auxillary. 

## 2019-09-27 LAB — SURGICAL PATHOLOGY

## 2021-04-01 ENCOUNTER — Emergency Department: Payer: Managed Care, Other (non HMO)

## 2021-04-01 ENCOUNTER — Encounter: Payer: Self-pay | Admitting: Emergency Medicine

## 2021-04-01 ENCOUNTER — Other Ambulatory Visit: Payer: Self-pay

## 2021-04-01 ENCOUNTER — Emergency Department
Admission: EM | Admit: 2021-04-01 | Discharge: 2021-04-01 | Disposition: A | Discharge status: Home / Self Care | Payer: Managed Care, Other (non HMO) | Attending: Emergency Medicine | Admitting: Emergency Medicine

## 2021-04-01 DIAGNOSIS — K219 Gastro-esophageal reflux disease without esophagitis: Secondary | ICD-10-CM | POA: Diagnosis not present

## 2021-04-01 DIAGNOSIS — N938 Other specified abnormal uterine and vaginal bleeding: Secondary | ICD-10-CM | POA: Insufficient documentation

## 2021-04-01 DIAGNOSIS — N939 Abnormal uterine and vaginal bleeding, unspecified: Secondary | ICD-10-CM

## 2021-04-01 DIAGNOSIS — R109 Unspecified abdominal pain: Secondary | ICD-10-CM | POA: Diagnosis present

## 2021-04-01 LAB — CBC
HCT: 38.6 % (ref 36.0–46.0)
Hemoglobin: 13.3 g/dL (ref 12.0–15.0)
MCH: 29.8 pg (ref 26.0–34.0)
MCHC: 34.5 g/dL (ref 30.0–36.0)
MCV: 86.4 fL (ref 80.0–100.0)
Platelets: 187 10*3/uL (ref 150–400)
RBC: 4.47 MIL/uL (ref 3.87–5.11)
RDW: 12.6 % (ref 11.5–15.5)
WBC: 6.1 10*3/uL (ref 4.0–10.5)
nRBC: 0 % (ref 0.0–0.2)

## 2021-04-01 LAB — URINALYSIS, COMPLETE (UACMP) WITH MICROSCOPIC
Bacteria, UA: NONE SEEN
RBC / HPF: >50 RBC/hpf — ABNORMAL HIGH (ref 0–5)
Specific Gravity, Urine: 1.022 (ref 1.005–1.030)
Squamous Epithelial / HPF: NONE SEEN (ref 0–5)

## 2021-04-01 LAB — TYPE AND SCREEN
ABO/RH(D): A POS
Antibody Screen: NEGATIVE

## 2021-04-01 LAB — COMPREHENSIVE METABOLIC PANEL
ALT: 21 U/L (ref 0–44)
AST: 18 U/L (ref 15–41)
Albumin: 4.5 g/dL (ref 3.5–5.0)
Alkaline Phosphatase: 61 U/L (ref 38–126)
Anion gap: 6 (ref 5–15)
BUN: 16 mg/dL (ref 6–20)
CO2: 22 mmol/L (ref 22–32)
Calcium: 9.4 mg/dL (ref 8.9–10.3)
Chloride: 108 mmol/L (ref 98–111)
Creatinine, Ser: 0.76 mg/dL (ref 0.44–1.00)
GFR, Estimated: >60 mL/min (ref >60)
Glucose, Bld: 108 mg/dL — ABNORMAL HIGH (ref 70–99)
Potassium: 3.9 mmol/L (ref 3.5–5.1)
Sodium: 136 mmol/L (ref 135–145)
Total Bilirubin: 0.8 mg/dL (ref 0.3–1.2)
Total Protein: 7.2 g/dL (ref 6.5–8.1)

## 2021-04-01 LAB — POC URINE PREG, ED: Preg Test, Ur: NEGATIVE

## 2021-04-01 LAB — LIPASE, BLOOD: Lipase: 29 U/L (ref 11–51)

## 2021-04-01 NOTE — ED Triage Notes (Signed)
Pt via POV from home. Pt c/o lower abd pain and vaginal bleeding since last night. Pt states that she has been bleeding dark and bright blood. Denies urinary sym. Denies NVD. Pt is A&Ox4 and NAD

## 2021-04-01 NOTE — ED Notes (Signed)
See triage note  Presents with lower back and abd pain  States pain started last pm   Developed some bleeding   States bleeding has been heavy dark and light blood noted  Denies any n/v//d or fever

## 2021-04-01 NOTE — ED Provider Notes (Signed)
  Physical Exam  BP 130/85 (BP Location: Right Arm)   Pulse 88   Temp 97.7 F (36.5 C) (Oral)   Resp 18   Ht 5\' 2"  (1.575 m)   Wt 87.5 kg   SpO2 100%   BMI 35.30 kg/m   Physical Exam  ED Course/Procedures     Procedures  MDM     Assumed patient care from Rollene Fare, NP.  Pelvic ultrasound showed no acute abnormality.  We will have patient follow-up with her OB/GYN.  All patient questions were answered.  Patient declined pain medication offered during this emergency department encounter.     Vallarie Mare [place], PA-C 04/01/21 1726    Blake Divine, MD 04/01/21 [phone]

## 2021-05-15 NOTE — ED Provider Notes (Signed)
[facility] Emergency Department Provider Note  ____________________________________________  Time seen: Approximately 6:25 PM  I have reviewed the triage vital signs and the nursing notes.   HISTORY  Chief Complaint Abdominal Pain and Vaginal Bleeding    HPI Charlene Garcia is a 35 y.o. female presents to the ER for treatment and evaluation of lower abdominal pain with vaginal bleeding since last night. Bleeding has been dark to bright. She has not passed clots.  Past Medical History:  Diagnosis Date   GERD (gastroesophageal reflux disease)    Gestational diabetes    Herpes simplex infection    Inside mouth   History of abnormal Pap smear 2013   HPV   No pertinent past medical history     Patient Active Problem List   Diagnosis Date Noted   S/P cesarean section 09/25/2019   Elevated blood pressure affecting pregnancy in third trimester, antepartum 09/12/2019   Elevated BP without diagnosis of hypertension 09/05/2019   Irregular menses 01/31/2014   Chronic pelvic pain in female 06/30/2012   Dyspareunia 05/14/2012   Depression 10/27/2011    Past Surgical History:  Procedure Laterality Date   CESAREAN SECTION     2009   CESAREAN SECTION WITH BILATERAL TUBAL LIGATION  09/23/2019   Procedure: CESAREAN SECTION WITH BILATERAL TUBAL LIGATION;  Surgeon: Boykin Nearing, MD;  Location: [facility];  Service: Obstetrics;;   EXCISION MASS UPPER EXTREMETIES Right 09/01/2017   Procedure: EXCISION AXILLARY MASS;  Surgeon: Leonie Green, MD;  Location: [facility] OR[facility];  Service: General;  Laterality: Right;   LAPAROSCOPY  06/30/2012   Procedure: LAPAROSCOPY OPERATIVE;  Surgeon: Donnamae Jude, MD;  Location: [facility];  Service: Gynecology;  Laterality: N/A;  with peritoneal biopsy   WISDOM TOOTH EXTRACTION  2013   x1    Prior to Admission medications   Medication Sig Start Date End Date Taking? Authorizing Provider  acetaminophen (TYLENOL) 500 MG  tablet Take 2 tablets (1,000 mg total) by mouth every 6 (six) hours. 09/26/19   McVey, Murray Hodgkins, CNM  EPINEPHrine 0.3 mg/0.3 mL IJ SOAJ injection INJECT INTO THIGH MUSCLE AS NEEDED FOR ANAPHYLAXIS. 10/16/16   [provider]  ferrous sulfate 325 (65 FE) MG tablet Take 1 tablet (325 mg total) by mouth daily with breakfast. 09/27/19   McVey, Murray Hodgkins, CNM  ibuprofen (ADVIL) 600 MG tablet Take 1 tablet (600 mg total) by mouth every 6 (six) hours as needed for mild pain or cramping. 09/26/19   McVey, Murray Hodgkins, CNM  Prenatal 28-0.8 MG TABS Take 1 tablet by mouth daily.     [provider]  senna-docusate (SENOKOT-S) 8.6-50 MG tablet Take 2 tablets by mouth daily. 09/27/19   McVey, Murray Hodgkins, CNM  simethicone (MYLICON) 80 MG chewable tablet Chew 1 tablet (80 mg total) by mouth 3 (three) times daily after meals. 09/26/19   McVey, Murray Hodgkins, CNM  valACYclovir (VALTREX) 500 MG tablet Take 500 mg by mouth as needed.  04/13/19   [provider]    Allergies Ortho-novum 1-35 (28) [norethin-eth estrad triphasic]  Family History  Problem Relation Age of Onset   Heart disease Mother    Heart disease Brother    Heart disease Maternal Grandfather     Social History Social History   Tobacco Use   Smoking status: Never   Smokeless tobacco: Never  Vaping Use   Vaping Use: Never used  Substance Use Topics   Alcohol use: No   Drug use: No  Review of Systems Constitutional: Negative for fever. Respiratory: Negative for shortness of breath or cough. Gastrointestinal: Positive for abdominal pain; Negative for nausea , Negative for vomiting. Genitourinary: Negative for dysuria , Negative for vaginal discharge. Positive for vaginal bleeding. Musculoskeletal: Negative for back pain. Skin: Negative for acute skin changes/rash/lesion. ____________________________________________   PHYSICAL EXAM:  VITAL SIGNS: ED Triage Vitals  Enc Vitals Group     BP 04/01/21 1138 (!)  134/94     Pulse Rate 04/01/21 1138 98     Resp 04/01/21 1138 20     Temp 04/01/21 1138 97.7 F (36.5 C)     Temp Source 04/01/21 1138 Oral     SpO2 04/01/21 1138 100 %     Weight 04/01/21 1139 193 lb (87.5 kg)     Height 04/01/21 1139 '5\' 2"'$  (1.575 m)     Head Circumference --      Peak Flow --      Pain Score 04/01/21 1139 7     Pain Loc --      Pain Edu? --      Excl. in [place]? --     Constitutional: Alert and oriented. Well appearing and in no acute distress. Eyes: Conjunctivae are normal. Head: Atraumatic. Nose: No congestion/rhinnorhea. Mouth/Throat: Mucous membranes are moist. Respiratory: Normal respiratory effort.  No retractions. Gastrointestinal: Bowel sounds active x 4; Abdomen is soft without rebound or guarding. Genitourinary: Pelvic exam: deferred. Musculoskeletal: No extremity tenderness nor edema.  Neurologic:  Normal speech and language. No gross focal neurologic deficits are appreciated. Speech is normal. No gait instability. Skin:  Skin is warm, dry and intact. No rash noted on exposed skin. Psychiatric: Mood and affect are normal. Speech and behavior are normal.  ____________________________________________   LABS (all labs ordered are listed, but only abnormal results are displayed)  Labs Reviewed  COMPREHENSIVE METABOLIC PANEL - Abnormal; Notable for the following components:      Result Value   Glucose, Bld 108 (*)    All other components within normal limits  URINALYSIS, COMPLETE (UACMP) WITH MICROSCOPIC - Abnormal; Notable for the following components:   Color, Urine RED (*)    APPearance TURBID (*)    Glucose, UA   (*)    Value: TEST NOT REPORTED DUE TO COLOR INTERFERENCE OF URINE PIGMENT   Hgb urine dipstick   (*)    Value: TEST NOT REPORTED DUE TO COLOR INTERFERENCE OF URINE PIGMENT   Bilirubin Urine   (*)    Value: TEST NOT REPORTED DUE TO COLOR INTERFERENCE OF URINE PIGMENT   Ketones, ur   (*)    Value: TEST NOT REPORTED DUE TO COLOR  INTERFERENCE OF URINE PIGMENT   Protein, ur   (*)    Value: TEST NOT REPORTED DUE TO COLOR INTERFERENCE OF URINE PIGMENT   Nitrite   (*)    Value: TEST NOT REPORTED DUE TO COLOR INTERFERENCE OF URINE PIGMENT   Leukocytes,Ua   (*)    Value: TEST NOT REPORTED DUE TO COLOR INTERFERENCE OF URINE PIGMENT   RBC / HPF >50 (*)    All other components within normal limits  LIPASE, BLOOD  CBC  POC URINE PREG, ED  TYPE AND SCREEN   ____________________________________________  RADIOLOGY  Prominent endometrium without mass. ____________________________________________  Procedures  ____________________________________________  35 year old female presents to the emergency department for treatment and evaluation of vaginal bleeding. See HPI.   [place] without concerning findings. She will be discharged to follow up with  gynecology.  INITIAL IMPRESSION / ASSESSMENT AND PLAN / ED COURSE  Pertinent labs & imaging results that were available during my care of the patient were reviewed by me and considered in my medical decision making (see chart for details).  ____________________________________________   FINAL CLINICAL IMPRESSION(S) / ED DIAGNOSES  Final diagnoses:  Abnormal vaginal bleeding    Note:  This document was prepared using Dragon voice recognition software and may include unintentional dictation errors.   Victorino Dike, FNP 05/15/21 1836    Lucrezia Starch, MD 05/15/21 [phone]

## 2022-09-22 IMAGING — US US PELVIS COMPLETE WITH TRANSVAGINAL
1 series · 14 of 25 positions shown · non-contrast
Comparison: 03/26/2012

CLINICAL DATA: Abnormal vaginal bleeding



[Series 1: us pelvis complete with transvaginal · 0.20mm/px · 14 of 78 slices shown]
[im 1/78]
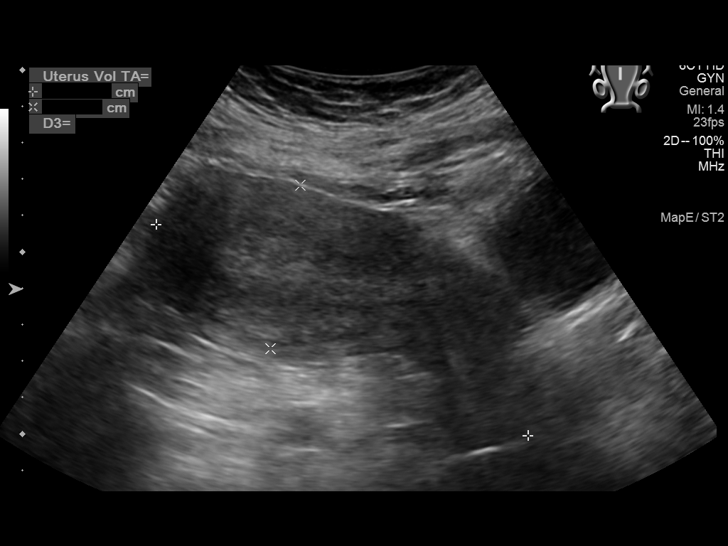
[im 7/78]
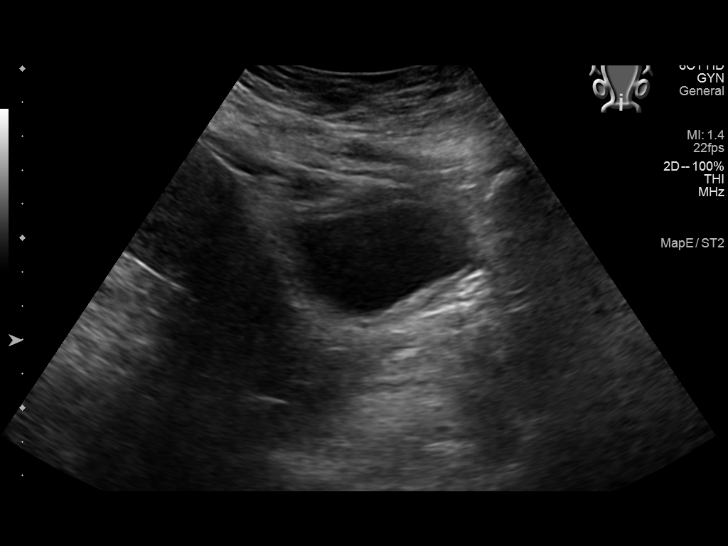
[im 13/78]
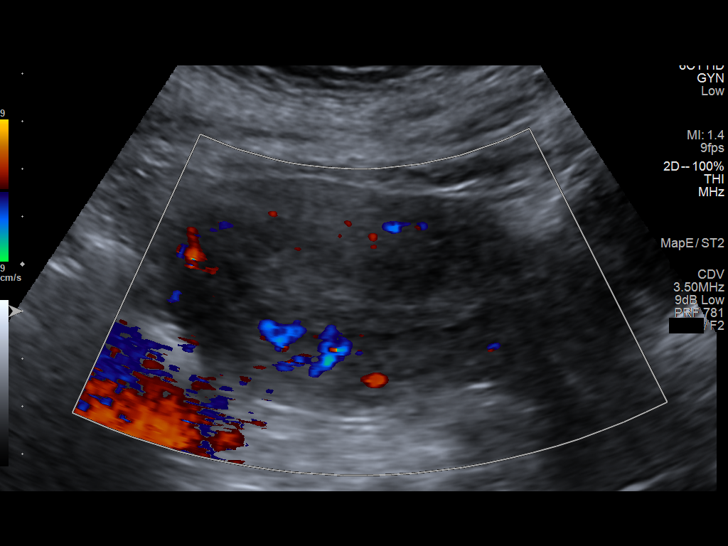
[im 20/78]
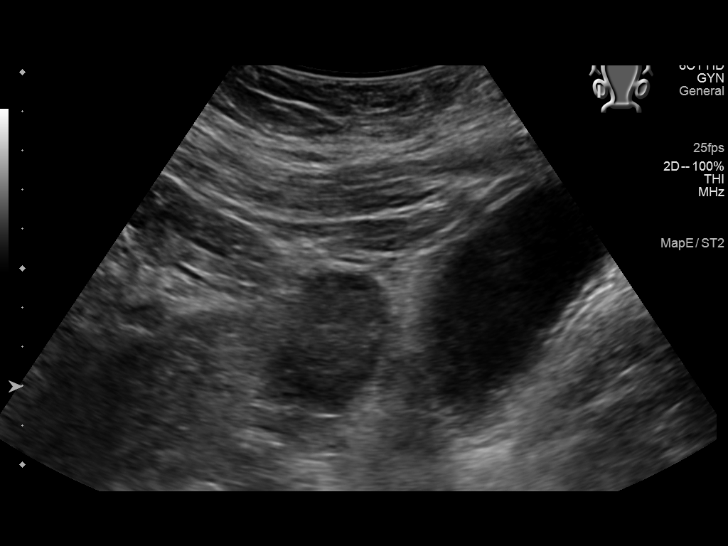
[im 26/78]
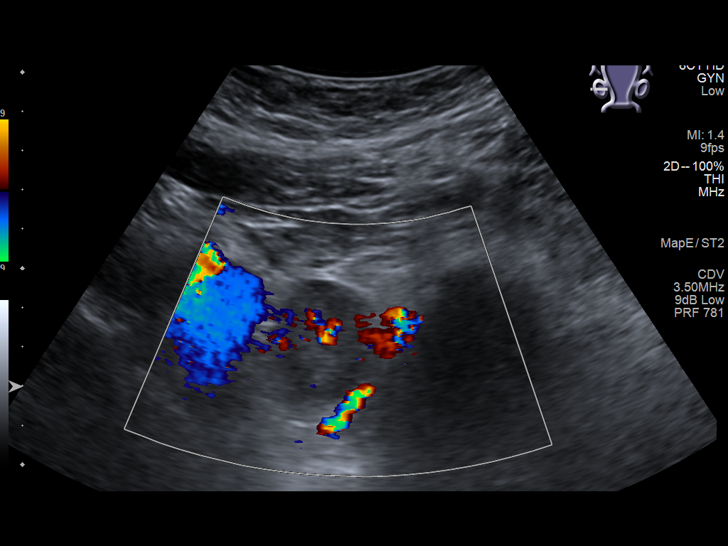
[im 29/78]
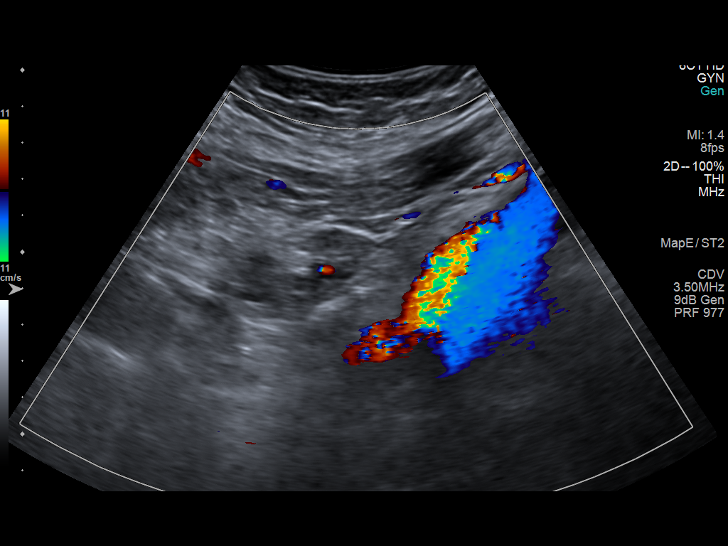
[im 36/78]
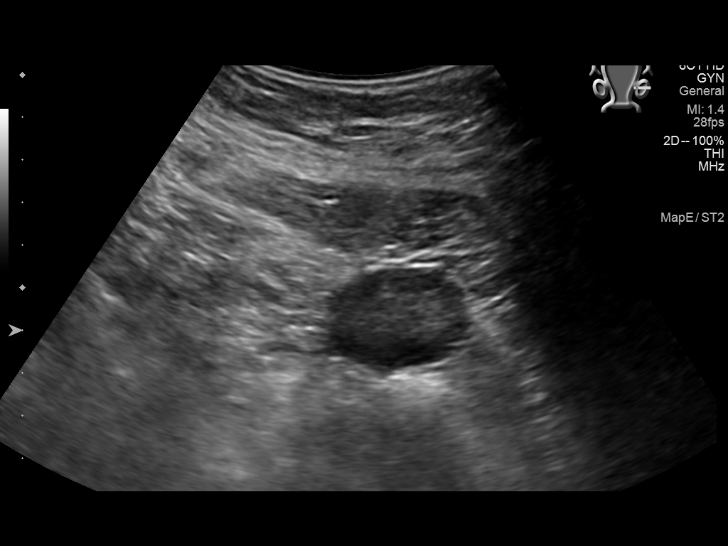
[im 42/78]
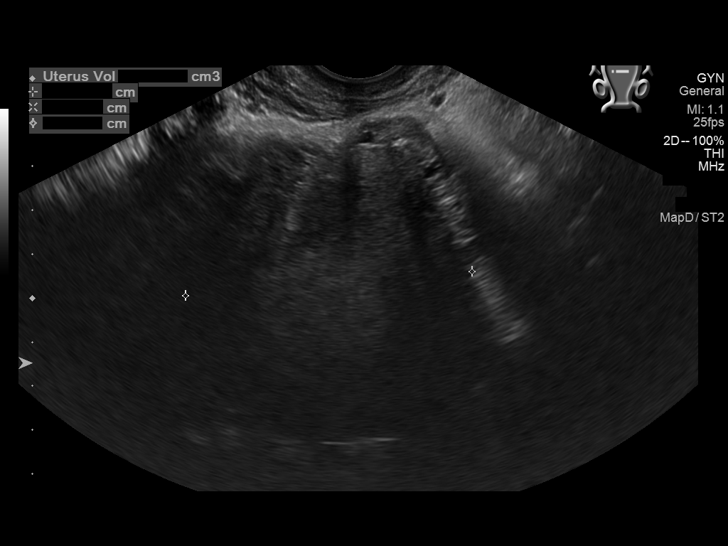
[im 49/78]
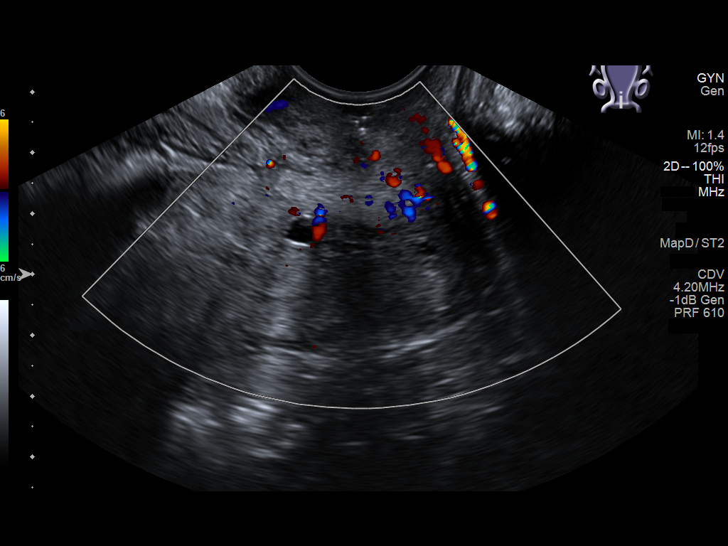
[im 52/78]
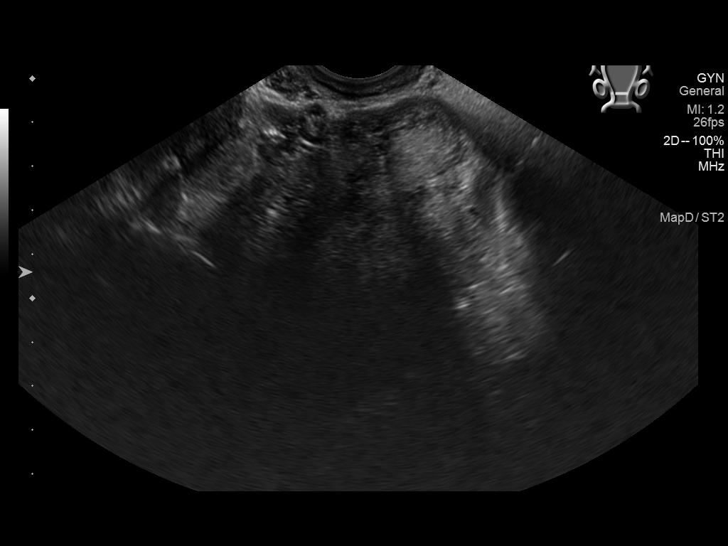
[im 58/78]
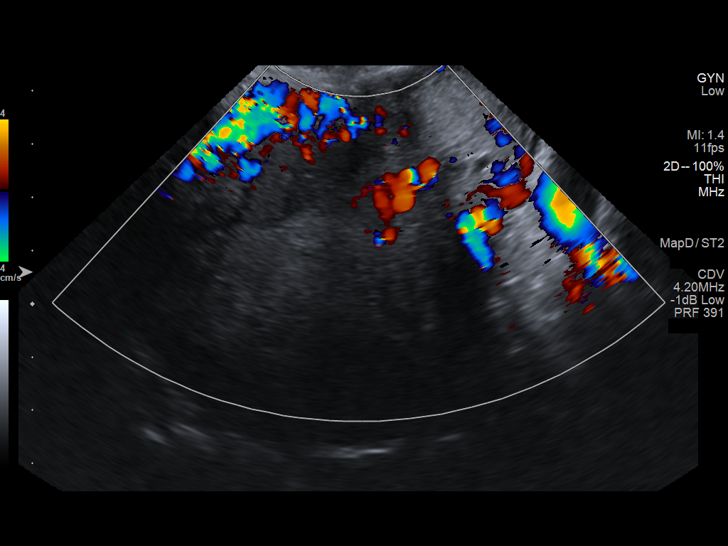
[im 65/78]
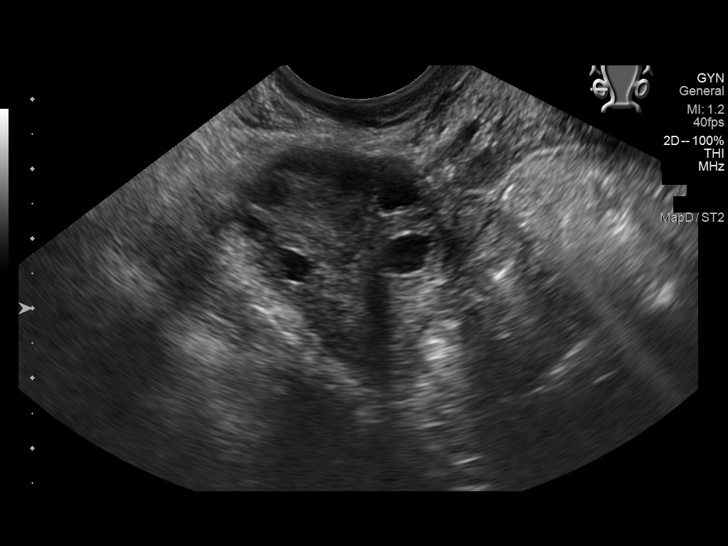
[im 71/78]
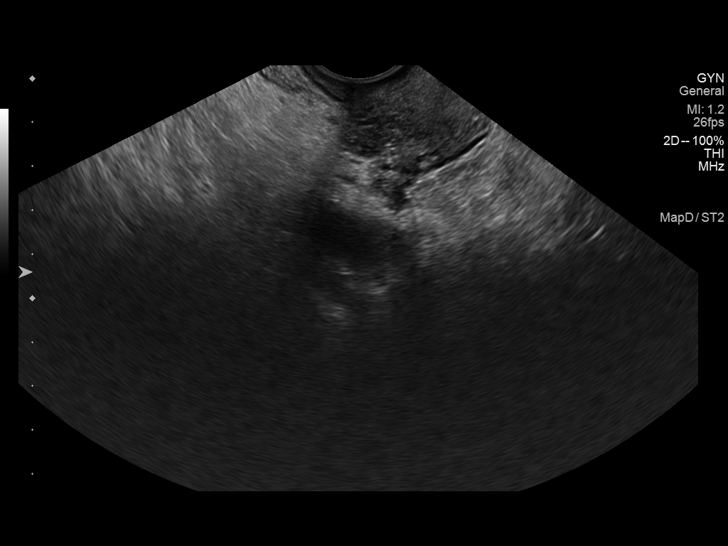
[im 78/78]
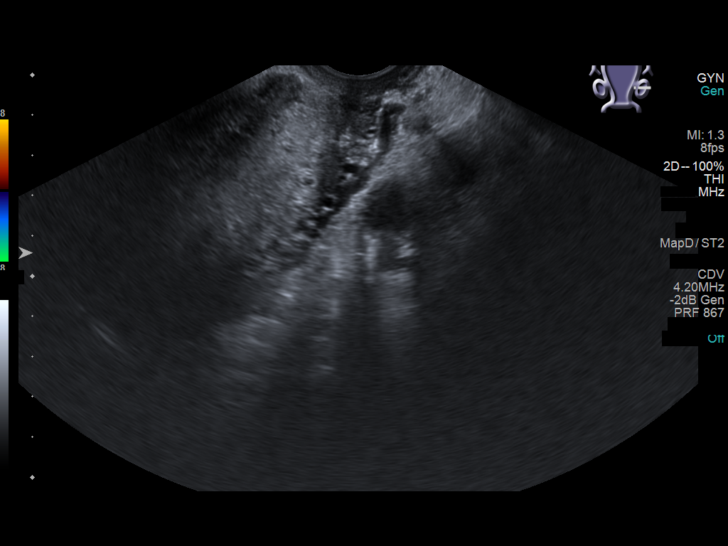

[14 of 25 positions shown; findings below may reference images not displayed]

FINDINGS: Uterus

Measurements: 11.6 x 4.8 x 6.5 cm. = volume: 192 mL. No fibroids or
other mass visualized.

Endometrium

Thickness: 22.5 mm. Generalized thickening is noted although no
discrete mass is seen. This is likely related to the patient's
current menstrual status.

Right ovary

Measurements: 3.9 x 2.6 x 3.0 cm. = volume: 15.8 mL. Normal
appearance/no adnexal mass.

Left ovary

Measurements: 3.6 x 2.3 x 3.3 cm. = volume: 14.4. mL. Normal
appearance/no adnexal mass.

Other findings

No abnormal free fluid.
IMPRESSION: Prominent endometrium likely related to the patient's current
menstrual status. No focal mass is seen.

No other abnormality is seen.

## 2023-05-27 ENCOUNTER — Ambulatory Visit (LOCAL_COMMUNITY_HEALTH_CENTER): Payer: Medicaid Other

## 2023-05-27 ENCOUNTER — Ambulatory Visit: Payer: Medicaid Other

## 2023-05-27 DIAGNOSIS — Z23 Encounter for immunization: Secondary | ICD-10-CM

## 2023-05-27 DIAGNOSIS — Z719 Counseling, unspecified: Secondary | ICD-10-CM

## 2023-05-27 NOTE — Progress Notes (Signed)
In nurse clinic for immunizations needed for school. Patient requesting Hep B vaccine. With information, RN found 3 [facility]s for patient under last names. Sent for merging. Per [facility] record, patient already has 2 Hep B vaccines and needs final dose. RN explained this to patient; patient agrees. Voices no concerns. VIS reviewed and given to patient. Vaccine (Hep B - Recombivax Adult) tolerated well; no issues noted. [facility] updated and copies given to patient. All questions answered and verbalizes understanding.   Abagail Kitchens, RN

## 2024-01-22 ENCOUNTER — Other Ambulatory Visit: Payer: Self-pay | Admitting: Obstetrics and Gynecology

## 2024-01-22 DIAGNOSIS — N63 Unspecified lump in unspecified breast: Secondary | ICD-10-CM

## 2024-01-28 ENCOUNTER — Ambulatory Visit
Admission: RE | Admit: 2024-01-28 | Discharge: 2024-01-28 | Disposition: A | Discharge status: Home / Self Care | Source: Ambulatory Visit | Attending: Obstetrics and Gynecology | Admitting: Obstetrics and Gynecology

## 2024-01-28 DIAGNOSIS — N63 Unspecified lump in unspecified breast: Secondary | ICD-10-CM | POA: Diagnosis present

## 2024-05-25 NOTE — Progress Notes (Signed)
 Chief Complaint:    Charlene Garcia is a 38 y.o. female here for left breast recheck  Pt returns for re eval of left breast nodule that the pt felt and I examined 3 weeks ago . She states it has not changed  and she still feels it . Non tender  Diagnostic Bilateral mammogram right breast u/s 01/2024 and  Birad 1  Past Medical History:  has a past medical history of Anxiety, Depression, HSV (herpes simplex virus) infection, and Infertility management.  Past Surgical History:  has a past surgical history that includes Diagnostic laparoscopy (2013); Tonsillectomy; Excision of mass on Rt arm (09/01/2017); Tubal ligation (09/23/2019); Cesarean section (03/09/2008 09/23/19); and Pelvic laparoscopy (2013). Family History: family history includes Atrial fibrillation (Abnormal heart rhythm sometimes requiring treatment with blood thinners) in her maternal grandfather; Coronary Artery Disease (Blocked arteries around heart) in her maternal grandfather; Irregular Heart Beat (Arrhythmia) in her mother; No Known Problems in her brother and daughter. Social History:  reports that she has never smoked. She has never been exposed to tobacco smoke. She has never used smokeless tobacco. She reports that she does not currently use alcohol. She reports that she does not use drugs. OB/GYN History:  OB History     Gravida  2   Para  2   Term  2   Preterm      AB      Living  2      SAB      IAB      Ectopic      Molar      Multiple      Live Births  2          Allergies: is allergic to ortho micoronor [norethindrone  (contraceptive)]. Medications:  Current Outpatient Medications:  .  progesterone (PROMETRIUM) 200 MG capsule, Take 1 capsule (200 mg total) by mouth at bedtime for 10 days, Disp: 10 capsule, Rfl: 11 .  acetaminophen  (TYLENOL ) 500 MG tablet, Take 500 mg by mouth every 6 (six) hours as needed for Pain (Patient not taking: Reported on 05/25/2024), Disp: , Rfl:  .  clindamycin  (CLEOCIN T) 1 % topical solution, Apply topically 2 (two) times daily (Patient not taking: Reported on 05/25/2024), Disp: 60 mL, Rfl: 1 .  diphenhydramine  HCl (ALLERGY RELIEF ORAL), Take 1 tablet by mouth once daily as needed (Patient not taking: Reported on 05/25/2024), Disp: , Rfl:  .  drospirenone-ethinyl estradioL  (YAZ) 3-0.02 mg tablet, Take 1 tablet by mouth once daily (Patient not taking: Reported on 01/21/2024), Disp: 30 tablet, Rfl: 11 .  montelukast (SINGULAIR) 10 mg tablet, Take 1 tablet (10 mg total) by mouth at bedtime, Disp: 30 tablet, Rfl: 11 .  mupirocin  (BACTROBAN ) 2 % ointment, Apply topically once daily (Patient not taking: Reported on 01/21/2024), Disp: 22 g, Rfl: 0 .  predniSONE (DELTASONE) 10 MG tablet, 40mg  x 2 days, 30mg  x 2days, 20mg  x 2days, 10mg  x 2days (Patient not taking: Reported on 01/21/2024), Disp: 20 tablet, Rfl: 0 .  valACYclovir (VALTREX) 500 MG tablet, Take 500 mg by mouth 2 (two) times daily as needed (Patient not taking: Reported on 05/25/2024), Disp: , Rfl:   Review of Systems: General:   No fatigue or weight loss Eyes:   No vision changes Ears:   No hearing difficulty Respiratory:                No cough or shortness of breath Pulmonary:   No asthma or shortness of breath Cardiovascular:  No chest pain, palpitations, dyspnea on exertion Gastrointestinal:          No abdominal bloating, chronic diarrhea, constipations, masses, pain or hematochezia Genitourinary:  No hematuria, dysuria, abnormal vaginal discharge, pelvic pain, Menometrorrhagia Lymphatic:  No swollen lymph nodes Musculoskeletal: No muscle weakness Neurologic:  No extremity weakness, syncope, seizure disorder Psychiatric:  No history of depression, delusions or suicidal/homicidal ideation    Exam:   Vitals:   05/25/24 1450  BP: 120/86  Pulse: 92    Body mass index is 40.06 kg/m.  WDWN white/  female in NAD   Lungs: CTA  CV : RRR without murmur   Breast: exam done in sitting  and lying position : No dimpling or retraction, no dominant mass, no spontaneous discharge, no axillary adenopathy Left breast : area of concern 0930 2.75 cm from breast 1x1 cm rubbery mass .   Impression:   The primary encounter diagnosis was Breast mass in female. A diagnosis of Mass of upper inner quadrant of left breast was also pertinent to this visit.    Plan:   Breast u/s left to further define  Follow based on findings. If tenderness develops conservative tx with nsaids , decrease caffeine , oil of primrose( ? Usefulness)   Orders Placed This Encounter  Procedures  . Mammo diagnostic digital left    Standing Status:   Future    Expected Date:   05/25/2024    Expiration Date:   06/25/2025    Reason for Exam: (Free Text):   left breast mass at 9:30 position, 2.5 cm from nipple    Is the patient pregnant?:   No    Preferred Location::   Other - comment below    Release to patient:   Immediate  . Mammo US  breast limited right    Standing Status:   Future    Expected Date:   05/25/2024    Expiration Date:   06/25/2025    Reason for Exam: (Free Text):   left breast mass at 9:30 position, 2.5 cm from nipple    Is the patient pregnant?:   No    Preferred Location::   Other - comment below    Release to patient:   Immediate  . Mammo US  breast limited left    Standing Status:   Future    Expected Date:   05/25/2024    Expiration Date:   06/25/2025    Reason for Exam: (Free Text):   left breast mass at 9:30 position, 2.5 cm from nipple    Is the patient pregnant?:   No    Preferred Location::   Other - comment below    Release to patient:   Immediate    No follow-ups on file.  Charlene CLARYCE DINSMORE, MD

## 2024-06-10 ENCOUNTER — Emergency Department

## 2024-06-10 ENCOUNTER — Emergency Department
Admission: EM | Admit: 2024-06-10 | Discharge: 2024-06-10 | Disposition: A | Discharge status: Home / Self Care | Attending: Emergency Medicine | Admitting: Emergency Medicine

## 2024-06-10 ENCOUNTER — Encounter: Payer: Self-pay | Admitting: Intensive Care

## 2024-06-10 ENCOUNTER — Other Ambulatory Visit: Payer: Self-pay

## 2024-06-10 DIAGNOSIS — N200 Calculus of kidney: Secondary | ICD-10-CM | POA: Insufficient documentation

## 2024-06-10 DIAGNOSIS — R1032 Left lower quadrant pain: Secondary | ICD-10-CM | POA: Diagnosis present

## 2024-06-10 LAB — CBC
HCT: 40.8 % (ref 36.0–46.0)
Hemoglobin: 13.9 g/dL (ref 12.0–15.0)
MCH: 30.2 pg (ref 26.0–34.0)
MCHC: 34.1 g/dL (ref 30.0–36.0)
MCV: 88.5 fL (ref 80.0–100.0)
Platelets: 233 K/uL (ref 150–400)
RBC: 4.61 MIL/uL (ref 3.87–5.11)
RDW: 13.1 % (ref 11.5–15.5)
WBC: 7.2 K/uL (ref 4.0–10.5)
nRBC: 0 % (ref 0.0–0.2)

## 2024-06-10 LAB — COMPREHENSIVE METABOLIC PANEL WITH GFR
ALT: 48 U/L — ABNORMAL HIGH (ref 0–44)
AST: 33 U/L (ref 15–41)
Albumin: 4.3 g/dL (ref 3.5–5.0)
Alkaline Phosphatase: 52 U/L (ref 38–126)
Anion gap: 11 (ref 5–15)
BUN: 20 mg/dL (ref 6–20)
CO2: 23 mmol/L (ref 22–32)
Calcium: 9.5 mg/dL (ref 8.9–10.3)
Chloride: 106 mmol/L (ref 98–111)
Creatinine, Ser: 1 mg/dL (ref 0.44–1.00)
GFR, Estimated: >60 mL/min (ref >60)
Glucose, Bld: 131 mg/dL — ABNORMAL HIGH (ref 70–99)
Potassium: 4.4 mmol/L (ref 3.5–5.1)
Sodium: 140 mmol/L (ref 135–145)
Total Bilirubin: 0.3 mg/dL (ref 0.0–1.2)
Total Protein: 7.2 g/dL (ref 6.5–8.1)

## 2024-06-10 LAB — URINALYSIS, ROUTINE W REFLEX MICROSCOPIC
Bilirubin Urine: NEGATIVE
Glucose, UA: NEGATIVE mg/dL
Ketones, ur: NEGATIVE mg/dL
Leukocytes,Ua: NEGATIVE
Nitrite: NEGATIVE
Protein, ur: NEGATIVE mg/dL
RBC / HPF: >50 RBC/hpf (ref 0–5)
Specific Gravity, Urine: 1.02 (ref 1.005–1.030)
pH: 5 (ref 5.0–8.0)

## 2024-06-10 LAB — LIPASE, BLOOD: Lipase: 32 U/L (ref 11–51)

## 2024-06-10 LAB — HCG, QUANTITATIVE, PREGNANCY: hCG, Beta Chain, Quant, S: <1 m[IU]/mL (ref <5)

## 2024-06-10 MED ORDER — ONDANSETRON HCL 4 MG/2ML IJ SOLN
4.0000 mg | INTRAMUSCULAR | Status: AC
  Administered 2024-06-10: 4 mg via INTRAVENOUS
  Filled 2024-06-10: qty 2

## 2024-06-10 MED ORDER — KETOROLAC TROMETHAMINE 30 MG/ML IJ SOLN
15.0000 mg | Freq: Once | INTRAMUSCULAR | Status: AC
  Administered 2024-06-10: 15 mg via INTRAVENOUS
  Filled 2024-06-10: qty 1

## 2024-06-10 MED ORDER — MORPHINE SULFATE (PF) 4 MG/ML IV SOLN
4.0000 mg | Freq: Once | INTRAVENOUS | Status: AC
  Administered 2024-06-10: 4 mg via INTRAVENOUS
  Filled 2024-06-10: qty 1

## 2024-06-10 MED ORDER — IOHEXOL 300 MG/ML  SOLN
100.0000 mL | Freq: Once | INTRAMUSCULAR | Status: AC | PRN
  Administered 2024-06-10: 100 mL via INTRAVENOUS

## 2024-06-10 NOTE — ED Triage Notes (Signed)
 Patient c/o twisting abdominal pain that radiates to pelvic area. Reports pain is worse with palpation. Reports nausea and sweaty  BM this AM.

## 2024-06-10 NOTE — ED Notes (Signed)
 Pt going to CT at this time.

## 2024-06-10 NOTE — ED Notes (Signed)
 Pt returned from [place]

## 2024-06-10 NOTE — Discharge Instructions (Addendum)
 You were seen in the emergency room for abdominal pain. It is important that you follow up closely with your primary care doctor in the next few days.   Please return to the emergency room right away if you are to develop a fever, severe nausea, your pain becomes severe or worsens, you are unable to keep food down, begin vomiting any dark or bloody fluid, you develop any dark or bloody stools, feel dehydrated, or other new concerns or symptoms arise.

## 2024-06-10 NOTE — ED Provider Notes (Signed)
 [facility] Provider Note    Event Date/Time   First MD Initiated Contact with Patient 06/10/24 [phone]     (approximate)   History   Abdominal Pain   HPI  Charlene Garcia is a 38 y.o. female with a history of a previous tubal ligation.  She reports previous pregnancy, denies pregnancy today.  She has been in her normal health until this morning around 6 AM she awoke with a acute sort of twisting or deep-seated pain in her mid to left lower quadrant.  It radiates pain somewhat towards her left lower back and a little bit towards her vaginal area but is more in the left lower quadrant.  Has been a very slight amount of vomiting.  No diarrhea.  Mild nausea but reports fairly severe pain since 6 AM in the left lower abdomen.  She has had no vaginal bleeding or discharge.  No fevers     Physical Exam   Triage Vital Signs: ED Triage Vitals [06/10/24 0906]  Encounter Vitals Group     BP (!) 145/109     Girls Systolic BP Percentile      Girls Diastolic BP Percentile      Boys Systolic BP Percentile      Boys Diastolic BP Percentile      Pulse Rate 81     Resp 16     Temp 97.6 F (36.4 C)     Temp Source Oral     SpO2 99 %     Weight 215 lb (97.5 kg)     Height 5' 3 (1.6 m)     Head Circumference      Peak Flow      Pain Score 10     Pain Loc      Pain Education      Exclude from Growth Chart     Most recent vital signs: Vitals:   06/10/24 1310 06/10/24 1310  BP:  114/76  Pulse: 69 68  Resp:  18  Temp:  98 F (36.7 C)  SpO2: 100% 100%     General: Awake, no distress except she appears in pain sort of hard for her to get comfortable in the bed reporting pain in the left flank and left lower quadrant. CV:  Good peripheral perfusion.  Resp:  Normal effort.  Normal rate. Abd:  No distention.  Tenderness primarily to palpation of left lower quadrant none noted suprapubically right lower quadrant or right upper quadrant.  No left upper  quadrant pain.  Pain is seated primarily in the mid to left lower quadrant along the lateral portion.  Denies pain to percussion of the costovertebral angles bilateral Other:     ED Results / Procedures / Treatments   Labs (all labs ordered are listed, but only abnormal results are displayed) Labs Reviewed  COMPREHENSIVE METABOLIC PANEL WITH GFR - Abnormal; Notable for the following components:      Result Value   Glucose, Bld 131 (*)    ALT 48 (*)    All other components within normal limits  URINALYSIS, ROUTINE W REFLEX MICROSCOPIC - Abnormal; Notable for the following components:   Color, Urine YELLOW (*)    APPearance HAZY (*)    Hgb urine dipstick LARGE (*)    Bacteria, UA RARE (*)    All other components within normal limits  LIPASE, BLOOD  CBC  HCG, QUANTITATIVE, PREGNANCY     EKG     RADIOLOGY  US  PELVIC  COMPLETE W TRANSVAGINAL AND TORSION R/O Result Date: 06/10/2024 CLINICAL DATA:  Left lower quadrant abdominal pain EXAM: TRANSABDOMINAL AND TRANSVAGINAL ULTRASOUND OF PELVIS DOPPLER ULTRASOUND OF OVARIES TECHNIQUE: Both transabdominal and transvaginal ultrasound examinations of the pelvis were performed. Transabdominal technique was performed for global imaging of the pelvis including uterus, ovaries, adnexal regions, and pelvic cul-de-sac. It was necessary to proceed with endovaginal exam following the transabdominal exam to visualize the endometrium and bilateral ovaries. Color and duplex Doppler ultrasound was utilized to evaluate blood flow to the ovaries. COMPARISON:  April 01, 2021 ultrasound FINDINGS: Uterus Measurements: 9.8 x 6.3 x 6.7 cm = volume: 215 mL. No fibroids or other mass visualized. Endometrium Thickness: 13.9 mm.  No focal abnormality visualized. Right ovary Measurements: 3.8 x 2.6 x 2.9 cm = volume: 15.1 mL. Multiple tiny peripheral ovarian follicles with hyperechoic central stroma, suggestive of polycystic ovarian morphology. Left ovary Measurements:  4.0 x 2.9 x 3.4 cm = volume: 20.7 mL. Likely multiple tiny peripheral ovarian follicles with hyperechoic central stoma, although evaluation is slightly limited as the left ovary could not be visualized on transvaginal exam and was only seen on transabdominal examination. Pulsed Doppler evaluation of both ovaries demonstrates normal low-resistance arterial and venous waveforms. Other findings No abnormal free fluid. IMPRESSION: 1. Likely multiple tiny peripheral ovarian follicles bilaterally. Findings may be seen with polycystic ovarian syndrome in the appropriate clinical setting. 2.  Prominent endometrium, likely related to menstrual cycle. Electronically Signed   By: Michaeline Blanch M.D.   On: 06/10/2024 12:23      PROCEDURES:  Critical Care performed: No  Procedures   MEDICATIONS ORDERED IN ED: Medications  ketorolac  (TORADOL ) 30 MG/ML injection 15 mg (15 mg Intravenous Given 06/10/24 0952)  morphine  (PF) 4 MG/ML injection 4 mg (4 mg Intravenous Given 06/10/24 0953)  ondansetron  (ZOFRAN ) injection 4 mg (4 mg Intravenous Given 06/10/24 0952)  iohexol  (OMNIPAQUE ) 300 MG/ML solution 100 mL (100 mLs Intravenous Contrast Given 06/10/24 1454)     IMPRESSION / MDM / ASSESSMENT AND PLAN / ED COURSE  I reviewed the triage vital signs and the nursing notes.                              Patient denies pregnancy reports previous tubal ligation.  Pregnancy test negative  Differential diagnosis includes, but is not limited to, acute intra-abdominal process such as ovarian cyst, rupture, torsion, or other considerations if pregnancy test is positive.  I however suspect it will be negative.  Will proceed with transvaginal ultrasound.  If transvaginal ultrasound imaging is normal then I will consider obtaining additional imaging such as CT to evaluate for causes such as colitis diverticulitis, kidney stone etc.  No acute symptoms of cardiovascular abnormality.  No respiratory symptoms.  Not  febrile.  Patient's presentation is most consistent with acute complicated illness / injury requiring diagnostic workup.  No vaginal discharge or bleeding.      Clinical Course as of 06/10/24 1505  Fri Jun 10, 2024  1329 Pain much improved.  Reviewed ultrasound results with the patient, she reports Dr. Lovetta had told her in the past he was suspicious she might have polycystic ovaries.  She will follow-up with him for this, at the present time still no excellent explanation for the severe pain that she was experiencing and is now remitted.  Discussed with patient we will proceed with CT to evaluate for other causes such as kidney stone, colitis,  other left lower quadrant pathology.  Doubt acute obstetric cause at this point [MQ]  1503 Patient pain and symptoms well-controlled.  Awaiting results of CT abdomen pelvis.  Dr. Jacolyn to follow-up on CT imaging results and final disposition.  Anticipate discharge to home if imaging reassuring with careful return precautions related to left lower quadrant pain [MQ]    Clinical Course User Index [MQ] Dicky Anes, MD   Symptoms much improved.  Thus far transvaginal ultrasound without clear causation no suspicion for possible polycystic type cause but no evidence of torsion.  Pain and symptoms have abated.  Awaiting CT result.  FINAL CLINICAL IMPRESSION(S) / ED DIAGNOSES   Final diagnoses:  LLQ abdominal pain     Rx / DC Orders   ED Discharge Orders     None        Note:  This document was prepared using Dragon voice recognition software and may include unintentional dictation errors.   Dicky Anes, MD 06/10/24 [phone]

## 2024-06-10 NOTE — ED Provider Notes (Signed)
 CT ABDOMEN PELVIS W CONTRAST Result Date: 06/10/2024 EXAM: CT ABDOMEN AND PELVIS WITH CONTRAST 06/10/2024 03:11:12 PM TECHNIQUE: CT of the abdomen and pelvis was performed with the administration of intravenous contrast. Multiplanar reformatted images are provided for review. Automated exposure control, iterative reconstruction, and/or weight-based adjustment of the mA/kV was utilized to reduce the radiation dose to as low as reasonably achievable. COMPARISON: Pelvic ultrasound 06/10/2024. CLINICAL HISTORY: LLQ abdominal pain. Patient c/o twisting abdominal pain that radiates to pelvic area. Reports pain is worse with palpation. Reports nausea and sweaty. FINDINGS: LOWER CHEST: Visualized lung bases are clear. LIVER: Moderate diffuse fatty infiltration of the liver is noted. No discrete lesions are present. GALLBLADDER AND BILE DUCTS: No wall thickening. No cholelithiasis. No biliary ductal dilatation. SPLEEN: Normal size. No focal lesion. PANCREAS: No mass. No ductal dilatation. ADRENAL GLANDS: Normal appearance. No mass. KIDNEYS, URETERS AND BLADDER: No nephrolithiasis is present. The ureters are within normal limits. A 2 mm stone is noted posteriorly within the urinary bladder. No hydronephrosis. No perinephric or periureteral stranding. GI AND BOWEL: Stomach demonstrates no acute abnormality. There is no bowel obstruction. No bowel wall thickening. PERITONEUM AND RETROPERITONEUM: No ascites. No free air. VASCULATURE: Aorta is normal in caliber. LYMPH NODES: No lymphadenopathy. REPRODUCTIVE ORGANS: No significant abnormality. BONES AND SOFT TISSUES: No acute osseous abnormality. No focal soft tissue abnormality. IMPRESSION: 1. No acute findings in the abdomen or pelvis related to the clinical history of LLQ abdominal pain. 2. Moderate diffuse hepatic steatosis without discrete lesions. 3. 2 mm stone in the urinary bladder. No nephrolithiasis. Ureters are within normal limits. This may represent a recently  passed stone Electronically signed by: Lonni Necessary MD 06/10/2024 03:21 PM EDT RP Workstation: [id number]   US  PELVIC COMPLETE W TRANSVAGINAL AND TORSION R/O Result Date: 06/10/2024 CLINICAL DATA:  Left lower quadrant abdominal pain EXAM: TRANSABDOMINAL AND TRANSVAGINAL ULTRASOUND OF PELVIS DOPPLER ULTRASOUND OF OVARIES TECHNIQUE: Both transabdominal and transvaginal ultrasound examinations of the pelvis were performed. Transabdominal technique was performed for global imaging of the pelvis including uterus, ovaries, adnexal regions, and pelvic cul-de-sac. It was necessary to proceed with endovaginal exam following the transabdominal exam to visualize the endometrium and bilateral ovaries. Color and duplex Doppler ultrasound was utilized to evaluate blood flow to the ovaries. COMPARISON:  April 01, 2021 ultrasound FINDINGS: Uterus Measurements: 9.8 x 6.3 x 6.7 cm = volume: 215 mL. No fibroids or other mass visualized. Endometrium Thickness: 13.9 mm.  No focal abnormality visualized. Right ovary Measurements: 3.8 x 2.6 x 2.9 cm = volume: 15.1 mL. Multiple tiny peripheral ovarian follicles with hyperechoic central stroma, suggestive of polycystic ovarian morphology. Left ovary Measurements: 4.0 x 2.9 x 3.4 cm = volume: 20.7 mL. Likely multiple tiny peripheral ovarian follicles with hyperechoic central stoma, although evaluation is slightly limited as the left ovary could not be visualized on transvaginal exam and was only seen on transabdominal examination. Pulsed Doppler evaluation of both ovaries demonstrates normal low-resistance arterial and venous waveforms. Other findings No abnormal free fluid. IMPRESSION: 1. Likely multiple tiny peripheral ovarian follicles bilaterally. Findings may be seen with polycystic ovarian syndrome in the appropriate clinical setting. 2.  Prominent endometrium, likely related to menstrual cycle. Electronically Signed   By: Michaeline Blanch M.D.   On: 06/10/2024 12:23     CT  with small stone in the bladder.  At this point with complete resolution of patient's symptoms left-sided nature and symptomatology I suspect she likely passed a kidney stone to the bladder as cause  of pain.  Discussed with the patient careful return precautions treatment recommendations, straining urine.  She is not driving herself home.  All pain symptoms resolved.  Comfortable with plan for discharge and follow-up with PCP  Return precautions and treatment recommendations and follow-up discussed with the patient who is agreeable with the plan.    Dicky Anes, MD 06/10/24 [phone]

## 2024-06-10 NOTE — ED Notes (Signed)
 Pt going to [place] at this time ?

## 2024-06-10 NOTE — ED Notes (Signed)
 Pt returned from CT

## 2024-07-05 ENCOUNTER — Other Ambulatory Visit: Payer: Self-pay | Admitting: Obstetrics and Gynecology

## 2024-07-05 DIAGNOSIS — N63 Unspecified lump in unspecified breast: Secondary | ICD-10-CM

## 2024-07-07 ENCOUNTER — Ambulatory Visit
Admission: RE | Admit: 2024-07-07 | Discharge: 2024-07-07 | Disposition: A | Discharge status: Home / Self Care | Source: Ambulatory Visit | Attending: Obstetrics and Gynecology | Admitting: Obstetrics and Gynecology

## 2024-07-07 DIAGNOSIS — N63 Unspecified lump in unspecified breast: Secondary | ICD-10-CM

## 2024-11-24 ENCOUNTER — Ambulatory Visit: Admitting: Family Medicine
